# Patient Record
Sex: Female | Born: 1970
Health system: Southern US, Community
[De-identification: ages and names within clinical notes are randomized; demographics above are authoritative.]

## PROBLEM LIST (undated history)

## (undated) DIAGNOSIS — T7840XA Allergy, unspecified, initial encounter: Secondary | ICD-10-CM

## (undated) DIAGNOSIS — F988 Other specified behavioral and emotional disorders with onset usually occurring in childhood and adolescence: Secondary | ICD-10-CM

## (undated) DIAGNOSIS — M2550 Pain in unspecified joint: Secondary | ICD-10-CM

## (undated) DIAGNOSIS — J45909 Unspecified asthma, uncomplicated: Secondary | ICD-10-CM

## (undated) DIAGNOSIS — E669 Obesity, unspecified: Secondary | ICD-10-CM

## (undated) DIAGNOSIS — J302 Other seasonal allergic rhinitis: Secondary | ICD-10-CM

## (undated) DIAGNOSIS — I252 Old myocardial infarction: Secondary | ICD-10-CM

## (undated) DIAGNOSIS — M549 Dorsalgia, unspecified: Secondary | ICD-10-CM

## (undated) HISTORY — PX: WISDOM TOOTH EXTRACTION: SHX21

## (undated) HISTORY — DX: Other seasonal allergic rhinitis: J30.2

## (undated) HISTORY — DX: Unspecified asthma, uncomplicated: J45.909

## (undated) HISTORY — DX: Dorsalgia, unspecified: M54.9

## (undated) HISTORY — DX: Old myocardial infarction: I25.2

## (undated) HISTORY — DX: Pain in unspecified joint: M25.50

## (undated) HISTORY — DX: Allergy, unspecified, initial encounter: T78.40XA

## (undated) HISTORY — DX: Other specified behavioral and emotional disorders with onset usually occurring in childhood and adolescence: F98.8

## (undated) HISTORY — PX: TONSILLECTOMY: SUR1361

## (undated) HISTORY — DX: Obesity, unspecified: E66.9

---

## 2008-01-18 ENCOUNTER — Other Ambulatory Visit: Admission: RE | Admit: 2008-01-18 | Discharge: 2008-01-18 | Payer: Self-pay | Admitting: Obstetrics and Gynecology

## 2010-12-17 DIAGNOSIS — J358 Other chronic diseases of tonsils and adenoids: Secondary | ICD-10-CM | POA: Insufficient documentation

## 2010-12-17 DIAGNOSIS — N943 Premenstrual tension syndrome: Secondary | ICD-10-CM | POA: Insufficient documentation

## 2010-12-17 DIAGNOSIS — J309 Allergic rhinitis, unspecified: Secondary | ICD-10-CM | POA: Insufficient documentation

## 2013-08-23 ENCOUNTER — Encounter: Payer: Self-pay | Admitting: Obstetrics & Gynecology

## 2013-08-23 ENCOUNTER — Ambulatory Visit (INDEPENDENT_AMBULATORY_CARE_PROVIDER_SITE_OTHER): Payer: Federal, State, Local not specified - PPO | Admitting: Obstetrics & Gynecology

## 2013-08-23 VITALS — BP 127/70 | HR 80 | Resp 16 | Ht 67.0 in | Wt 219.0 lb

## 2013-08-23 DIAGNOSIS — Z01419 Encounter for gynecological examination (general) (routine) without abnormal findings: Secondary | ICD-10-CM

## 2013-08-23 DIAGNOSIS — Z1151 Encounter for screening for human papillomavirus (HPV): Secondary | ICD-10-CM

## 2013-08-23 DIAGNOSIS — Z124 Encounter for screening for malignant neoplasm of cervix: Secondary | ICD-10-CM

## 2013-08-23 DIAGNOSIS — Z0000 Encounter for general adult medical examination without abnormal findings: Secondary | ICD-10-CM

## 2013-08-23 NOTE — Progress Notes (Signed)
Subjective:    Rachel [name] is a 43 y.o. female who presents for an annual exam. The patient has no complaints today. The patient is sexually active. GYN screening history: last pap: was normal. The patient wears seatbelts: yes. The patient participates in regular exercise: no. Has the patient ever been transfused or tattooed?: no. The patient reports that there is not domestic violence in her life.   Menstrual History: OB History   Grav Para Term Preterm Abortions TAB SAB Ect Mult Living   2 2 2       2       Menarche age: [age]  Patient's last menstrual period was 08/02/2013.    The following portions of the patient's history were reviewed and updated as appropriate: allergies, current medications, past family history, past medical history, past social history, past surgical history and problem list.  Review of Systems A comprehensive review of systems was negative. From [place] originally. She is a Teacher, English as a foreign languagepanish interpretor, free lance. Married for 21 years.  Kids are 7818 daughter and 43 yo son. Periods are monthly for 3 days. Her husband had a vasectomy. No mammogram ever.   Objective:    BP 127/70  Pulse 80  Resp 16  Ht 5\' 7"  (1.702 m)  Wt 219 lb (99.338 kg)  BMI 34.29 kg/m2  LMP 08/02/2013  General Appearance:    Alert, cooperative, no distress, appears stated age  Head:    Normocephalic, without obvious abnormality, atraumatic  Eyes:    PERRL, conjunctiva/corneas clear, EOM's intact, fundi    benign, both eyes  Ears:    Normal TM's and external ear canals, both ears  Nose:   Nares normal, septum midline, mucosa normal, no drainage    or sinus tenderness  Throat:   Lips, mucosa, and tongue normal; teeth and gums normal  Neck:   Supple, symmetrical, trachea midline, no adenopathy;    thyroid:  no enlargement/tenderness/nodules; no carotid   bruit or JVD  Back:     Symmetric, no curvature, ROM normal, no CVA tenderness  Lungs:     Clear to auscultation bilaterally, respirations  unlabored  Chest Wall:    No tenderness or deformity   Heart:    Regular rate and rhythm, S1 and S2 normal, no murmur, rub   or gallop  Breast Exam:    No tenderness, masses, or nipple abnormality  Abdomen:     Soft, non-tender, bowel sounds active all four quadrants,    no masses, no organomegaly  Genitalia:    Normal female without lesion, discharge or tenderness     Extremities:   Extremities normal, atraumatic, no cyanosis or edema  Pulses:   2+ and symmetric all extremities  Skin:   Skin color, texture, turgor normal, no rashes or lesions  Lymph nodes:   Cervical, supraclavicular, and axillary nodes normal  Neurologic:   CNII-XII intact, normal strength, sensation and reflexes    throughout  .    Assessment:    Healthy female exam.    Plan:     Breast self exam technique reviewed and patient encouraged to perform self-exam monthly. Mammogram. Thin prep Pap smear.  with cotesting

## 2013-08-30 ENCOUNTER — Ambulatory Visit (INDEPENDENT_AMBULATORY_CARE_PROVIDER_SITE_OTHER): Payer: Federal, State, Local not specified - PPO

## 2013-08-30 DIAGNOSIS — Z1231 Encounter for screening mammogram for malignant neoplasm of breast: Secondary | ICD-10-CM

## 2013-08-30 DIAGNOSIS — Z0000 Encounter for general adult medical examination without abnormal findings: Secondary | ICD-10-CM

## 2014-02-11 ENCOUNTER — Encounter: Payer: Self-pay | Admitting: Obstetrics & Gynecology

## 2014-04-08 ENCOUNTER — Emergency Department
Admission: EM | Admit: 2014-04-08 | Discharge: 2014-04-08 | Disposition: A | Discharge status: Home / Self Care | Payer: Federal, State, Local not specified - PPO | Source: Home / Self Care

## 2014-04-08 ENCOUNTER — Encounter: Payer: Self-pay | Admitting: *Deleted

## 2014-04-08 DIAGNOSIS — J069 Acute upper respiratory infection, unspecified: Secondary | ICD-10-CM

## 2014-04-08 DIAGNOSIS — J028 Acute pharyngitis due to other specified organisms: Secondary | ICD-10-CM

## 2014-04-08 LAB — POCT INFLUENZA A/B
INFLUENZA A, POC: NEGATIVE
INFLUENZA B, POC: NEGATIVE

## 2014-04-08 LAB — POCT RAPID STREP A (OFFICE): RAPID STREP A SCREEN: NEGATIVE

## 2014-04-08 MED ORDER — DOXYCYCLINE HYCLATE 100 MG PO CAPS: 100.0000 mg | ORAL_CAPSULE | Freq: Two times a day (BID) | ORAL | Status: AC

## 2014-04-08 NOTE — ED Notes (Signed)
Pt c/o yellow nasal congestion and dry cough x 1 wk. Denies fever.

## 2014-04-08 NOTE — ED Provider Notes (Signed)
CSN: [id number]     Arrival date & time 04/08/14  1044 History   None    Chief Complaint  Patient presents with  . Nasal Congestion    HPI  URI Symptoms Onset: 1 week  Description: sore throat, nasal congestion, body aches  Modifying factors:  Multiple sick contacts with similar sxs, no flu shot this year   Symptoms Nasal discharge: yes Fever: no Sore throat: yes Cough: no Wheezing: no Ear pain: no GI symptoms: no Sick contacts: yes  Red Flags  Stiff neck: no Dyspnea: no Rash: no Swallowing difficulty: no  Sinusitis Risk Factors Headache/face pain: no Double sickening: no tooth pain: no  Allergy Risk Factors Sneezing: no Itchy scratchy throat: no Seasonal symptoms: yes  Flu Risk Factors Headache: no muscle aches: mild  severe fatigue: mild    Past Medical History  Diagnosis Date  . Allergy   . Obesity    Past Surgical History  Procedure Laterality Date  . Wisdom tooth extraction    . Tonsillectomy     Family History  Problem Relation Age of Onset  . Heart attack Maternal Grandfather   . Heart attack Maternal Uncle   . Diabetes Maternal Aunt    History  Substance Use Topics  . Smoking status: Former Games developermoker  . Smokeless tobacco: Never Used  . Alcohol Use: No   OB History    Gravida Para Term Preterm AB TAB SAB Ectopic Multiple Living   2 2 2       2      Review of Systems  All other systems reviewed and are negative.   Allergies  Review of patient's allergies indicates no known allergies.  Home Medications   Prior to Admission medications   Medication Sig Start Date End Date Taking? Authorizing Provider  ciclesonide (OMNARIS) 50 MCG/ACT nasal spray Place 2 sprays into both nostrils daily.    Historical Provider, MD  fexofenadine (ALLEGRA) 180 MG tablet Take 180 mg by mouth daily.    Historical Provider, MD   BP 117/79 mmHg  Pulse 83  Temp(Src) 98 F (36.7 C) (Oral)  Resp 18  Ht 5\' 7"  (1.702 m)  Wt 221 lb (100.245 kg)  BMI  34.61 kg/m2  SpO2 99%  LMP 03/10/2014 Physical Exam  Constitutional: She appears well-developed and well-nourished.  HENT:  Head: Normocephalic and atraumatic.  Right Ear: External ear normal.  Left Ear: External ear normal.  +nasal erythema, rhinorrhea bilaterally, + post oropharyngeal erythema    Eyes: Conjunctivae are normal. Pupils are equal, round, and reactive to light.  Neck: Normal range of motion. Neck supple.  Cardiovascular: Normal rate and regular rhythm.   Pulmonary/Chest: Effort normal and breath sounds normal.  Neurological: She is alert.  Skin: Skin is warm.    ED Course  Procedures (including critical care time) Labs Review Labs Reviewed - No data to display  Imaging Review No results found.   MDM   1. Acute pharyngitis due to other specified organisms   2. URI (upper respiratory infection)    Likely viral process Rapid strep and flu negative Discussed supportive care and infectious/resp red flags ppx rx for doxy if sxs not improved/worsen Follow up as needed.     The patient and/or caregiver has been counseled thoroughly with regard to treatment plan and/or medications prescribed including dosage, schedule, interactions, rationale for use, and possible side effects and they verbalize understanding. Diagnoses and expected course of recovery discussed and will return if not improved as expected or  if the condition worsens. Patient and/or caregiver verbalized understanding.         Doree AlbeeSteven Noris Kulinski, MD 04/08/14 [phone]

## 2014-12-02 ENCOUNTER — Encounter: Payer: Self-pay | Admitting: Family Medicine

## 2014-12-02 ENCOUNTER — Ambulatory Visit (INDEPENDENT_AMBULATORY_CARE_PROVIDER_SITE_OTHER): Payer: Federal, State, Local not specified - PPO | Admitting: Family Medicine

## 2014-12-02 VITALS — BP 131/75 | HR 80 | Ht 67.0 in | Wt 228.0 lb

## 2014-12-02 DIAGNOSIS — Z283 Underimmunization status: Secondary | ICD-10-CM

## 2014-12-02 DIAGNOSIS — R635 Abnormal weight gain: Secondary | ICD-10-CM

## 2014-12-02 DIAGNOSIS — Z23 Encounter for immunization: Secondary | ICD-10-CM | POA: Diagnosis not present

## 2014-12-02 DIAGNOSIS — Z2839 Other underimmunization status: Secondary | ICD-10-CM

## 2014-12-02 DIAGNOSIS — M79644 Pain in right finger(s): Secondary | ICD-10-CM | POA: Diagnosis not present

## 2014-12-02 NOTE — Progress Notes (Signed)
CC: Rachel [name] is a 44 y.o. female is here for Establish Care   Subjective: HPI:  Very pleasant 44 year old here to establish care  Patient reports difficulty with losing weight. She admits there is a little bit that she can do with her diet to improve the type of calories she is consuming but she is already cut out all fried foods. She exercises most days of week but does not seem to get results. This is been going on for matter of months maybe even a year. Symptoms are moderate in severity on a weekly basis. She's never had her thyroid checked.  Complains of right index finger pain localized at the base of the finger and in some of the knuckles. It's worse when going from hot to cold or cold hot environments. It's worse with repetitive movements such as pulling weeds or gardening. Occasionally will swell. She denies any warmth or redness at the site of discomfort. No interventions as of yet but this has been going on for the majority of the summer. Denies any fevers, chills, muscle atrophy or weakness. Denies any recent or remote trauma  She would like me to help her with immunization requirements at [facility] she was born in [place] and had the majority of her pediatric care in [place]. She does not know the name of the clinic that she used to get her shots from. She believes she is up-to-date on all of her immunizations other than needing tetanus booster. She needs to have evidence of MMR, polio,and hepatitis B vaccinations.  Review of Systems - General ROS: negative for - chills, fever, night sweats, weight loss Ophthalmic ROS: negative for - decreased vision Psychological ROS: negative for - anxiety or depression ENT ROS: negative for - hearing change, nasal congestion, tinnitus or allergies Hematological and Lymphatic ROS: negative for - bleeding problems, bruising or swollen lymph nodes Breast ROS: negative Respiratory ROS: no cough, shortness of breath, or  wheezing Cardiovascular ROS: no chest pain or dyspnea on exertion Gastrointestinal ROS: no abdominal pain, change in bowel habits, or black or bloody stools Genito-Urinary ROS: negative for - genital discharge, genital ulcers, incontinence or abnormal bleeding from genitals Musculoskeletal ROS: negative for - joint pain or muscle painother than that described above Neurological ROS: negative for - headaches or memory loss Dermatological ROS: negative for lumps, mole changes, rash and skin lesion changes  Past Medical History  Diagnosis Date  . Allergy   . Obesity     Past Surgical History  Procedure Laterality Date  . Wisdom tooth extraction    . Tonsillectomy     Family History  Problem Relation Age of Onset  . Heart attack Maternal Grandfather   . Heart attack Maternal Uncle   . Diabetes Maternal Aunt     Social History   Social History  . Marital Status: Married    Spouse Name: N/A  . Number of Children: N/A  . Years of Education: N/A   Occupational History  . spanish interpreter    Social History Main Topics  . Smoking status: Former Research scientist (life sciences)  . Smokeless tobacco: Never Used  . Alcohol Use: No  . Drug Use: No  . Sexual Activity:    Partners: Male   Other Topics Concern  . Not on file   Social History Narrative     Objective: BP 131/75 mmHg  Pulse 80  Ht $R'5\' 7"'Kp$  (1.702 m)  Wt 228 lb (103.42 kg)  BMI 35.70 kg/m2  General: Alert and  Oriented, No Acute Distress HEENT: Pupils equal, round, reactive to light. Conjunctivae clear.  External ears unremarkable, canals clear with intact TMs with appropriate landmarks.  Middle ear appears open without effusion. Pink inferior turbinates.  Moist mucous membranes, pharynx without inflammation nor lesions.  Neck supple without palpable lymphadenopathy nor abnormal masses. Lungs: Clear to auscultation bilaterally, no wheezing/ronchi/rales.  Comfortable work of breathing. Good air movement. Cardiac: Regular rate and rhythm.  Normal S1/S2.  No murmurs, rubs, nor gallops.   Extremities: No peripheral edema.  Strong peripheral pulses.no swelling redness or warmth  At any of the joints in the right index finger. Full range of motion and strength. Pain is reproduced with passive extension of the finger.   Mental Status: No depression, anxiety, nor agitation. Skin: Warm and dry.  Assessment & Plan: Rachel [name] was seen today for establish care.  Diagnoses and all orders for this visit:  Abnormal weight gain -     TSH  Finger pain, right  Immunization deficiency -     Tdap vaccine greater than or equal to 7yo IM   Abnormal weight gain: Rule out hypothyroidism Finger pain: Suspect this is due to osteoarthritis, I showed her how to buddy tape her finger to the middle finger and encourage her to do this as many hours of the day as possible. Immunization deficiency: Giving Tdap now, it sounds like it's going to be nearly impossible to track down her old records outside this country. I've asked her to  Inquire with her admissions counselor on whether or not we can do labwork to prove her immunity.  Return in about 3 months (around 03/04/2015) for physical.

## 2014-12-03 LAB — TSH: TSH: 1.003 u[IU]/mL (ref 0.350–4.500)

## 2014-12-09 ENCOUNTER — Encounter: Payer: Self-pay | Admitting: *Deleted

## 2015-03-03 ENCOUNTER — Ambulatory Visit: Payer: Self-pay | Admitting: Podiatry

## 2015-03-10 ENCOUNTER — Encounter: Payer: Self-pay | Admitting: Podiatry

## 2015-03-10 ENCOUNTER — Ambulatory Visit (INDEPENDENT_AMBULATORY_CARE_PROVIDER_SITE_OTHER): Payer: Federal, State, Local not specified - PPO | Admitting: Podiatry

## 2015-03-10 VITALS — BP 137/75 | HR 82 | Ht 67.0 in | Wt 218.0 lb

## 2015-03-10 DIAGNOSIS — G5762 Lesion of plantar nerve, left lower limb: Secondary | ICD-10-CM | POA: Diagnosis not present

## 2015-03-10 DIAGNOSIS — M722 Plantar fascial fibromatosis: Secondary | ICD-10-CM | POA: Diagnosis not present

## 2015-03-10 DIAGNOSIS — M21969 Unspecified acquired deformity of unspecified lower leg: Secondary | ICD-10-CM | POA: Diagnosis not present

## 2015-03-10 NOTE — Progress Notes (Signed)
SUBJECTIVE: 44 y.o. year old female presents complaining of left foot pain for about a year.  2nd and 3rd toe are separating with pain, tingling, numbness with ambulation. Too painful to walk.  When wakes up or getting out of chair gets pain on left heel that needs be stretched before taking steps.  Wearing flat shoes or high heels, or sandals. On feet at work about half and half.  Does ride bike. If use treadmill, too painful to last long enough.   REVIEW OF SYSTEMS: Constitutional: negative for chills, fatigue, fevers, night sweats and weight loss Eyes: negative Ears, nose, mouth, throat, and face: negative Respiratory: negative Cardiovascular: negative Gastrointestinal: negative Genitourinary:negative Integument/breast: negative Hematologic/lymphatic: negative Musculoskeletal:negative Neurological: negative Endocrine: negative Allergic/Immunologic: negative  OBJECTIVE: DERMATOLOGIC EXAMINATION: Nails: Mild whitish discoloration at distal 1/4 of both great toe nails. No abnormal skin lesions.  VASCULAR EXAMINATION OF LOWER LIMBS: Pedal pulses: All pedal pulses are palpable with normal pulsation.  Temperature gradient from tibial crest to dorsum of foot is within normal bilateral. NEUROLOGIC EXAMINATION OF THE LOWER LIMBS: All epicritic and tactile sensations grossly intact.  MUSCULOSKELETAL EXAMINATION: Positive of hypermobile first ray in sagittal plane bilateral. Positive of pain at the 2nd intermetatarsal space with clicking of soft tissue mass with lateral pressure. Pain in left heel when getting out of bed or chair x 6 months.   ASSESSMENT: Hypermobile first ray bilateral. STJ hyperpronation bilateral. Motons's neuroma left.  Ligamentous laxity bilateral.  PLAN: Reviewed clinical findings and available treatment options, injection, change in shoe gear and activities, custom orthotics. Patient refused to take injection. Patient will return for custom  orthotics. Metatarsal binder dispensed to add stability of first ray bilateral.

## 2015-03-10 NOTE — Patient Instructions (Signed)
Seen for pain in left foot.  Possible Diagnosis: Plantar fasciitis left. Morton's neuroma at 2nd intermetatarsal space. Reviewed available treatment options. Metatarsal binder dispensed x 1 pr.  Will prepare for custom orthotics next week.

## 2015-03-20 ENCOUNTER — Ambulatory Visit: Payer: Federal, State, Local not specified - PPO | Admitting: Podiatry

## 2015-03-24 ENCOUNTER — Ambulatory Visit: Payer: Federal, State, Local not specified - PPO | Admitting: Podiatry

## 2015-04-10 ENCOUNTER — Encounter: Payer: Self-pay | Admitting: Podiatry

## 2015-04-10 ENCOUNTER — Other Ambulatory Visit: Payer: Self-pay

## 2015-04-10 ENCOUNTER — Ambulatory Visit (INDEPENDENT_AMBULATORY_CARE_PROVIDER_SITE_OTHER): Payer: Federal, State, Local not specified - PPO | Admitting: Podiatry

## 2015-04-10 VITALS — BP 132/69 | HR 80

## 2015-04-10 DIAGNOSIS — M216X9 Other acquired deformities of unspecified foot: Secondary | ICD-10-CM

## 2015-04-10 DIAGNOSIS — M216X1 Other acquired deformities of right foot: Secondary | ICD-10-CM | POA: Insufficient documentation

## 2015-04-10 DIAGNOSIS — M722 Plantar fascial fibromatosis: Secondary | ICD-10-CM

## 2015-04-10 DIAGNOSIS — G5762 Lesion of plantar nerve, left lower limb: Secondary | ICD-10-CM | POA: Diagnosis not present

## 2015-04-10 DIAGNOSIS — M21969 Unspecified acquired deformity of unspecified lower leg: Secondary | ICD-10-CM

## 2015-04-10 DIAGNOSIS — M216X2 Other acquired deformities of left foot: Principal | ICD-10-CM

## 2015-04-10 NOTE — Progress Notes (Signed)
Subjective: 44 year old female presents requesting custom orthotics made. She was seen last months and was treated with Metatarsal binder for weakened first ray, heel pain, and neuroma at 2nd interspace left foot. Stated that she has made some improvement in her feet since the last visit. She wants orthotics to be made to fit to dress shoes  OBJECTIVE: DERMATOLOGIC EXAMINATION: Nails: Mild whitish discoloration at distal 1/4 of both great toe nails. No abnormal skin lesions.  VASCULAR EXAMINATION OF LOWER LIMBS: Pedal pulses: All pedal pulses are palpable with normal pulsation.  Temperature gradient from tibial crest to dorsum of foot is within normal bilateral. NEUROLOGIC EXAMINATION OF THE LOWER LIMBS: All epicritic and tactile sensations grossly intact.  MUSCULOSKELETAL EXAMINATION: Positive of hypermobile first ray in sagittal plane bilateral. Positive of pain at the 2nd intermetatarsal space with clicking of soft tissue mass with lateral pressure. Pain in left heel when getting out of bed or chair x 6 months.   Radiographic examination reveal short first metatarsal bone bilateral, minimum change in lateral deviation angle of CCJ bilateral. Lateral view show normal CYMA line, elevated first ray right. Lateral left foot has anterior advancement of CYMA line with elevated first.  ASSESSMENT: Hypermobile first ray bilateral. STJ hyperpronation bilateral. Motons's neuroma left.  Ligamentous laxity bilateral.  PLAN: Reviewed clinical findings and available treatment options, injection, change in shoe gear and activities, custom orthotics. Patient refused to take injection. Patient will return for custom orthotics. Metatarsal binder dispensed to add stability of first ray bilateral.

## 2015-06-18 ENCOUNTER — Ambulatory Visit: Payer: Federal, State, Local not specified - PPO | Admitting: Podiatry

## 2015-08-19 DIAGNOSIS — L237 Allergic contact dermatitis due to plants, except food: Secondary | ICD-10-CM | POA: Diagnosis not present

## 2015-08-23 ENCOUNTER — Emergency Department
Admission: EM | Admit: 2015-08-23 | Discharge: 2015-08-23 | Disposition: A | Discharge status: Home / Self Care | Payer: Federal, State, Local not specified - PPO | Source: Home / Self Care | Attending: Family Medicine | Admitting: Family Medicine

## 2015-08-23 ENCOUNTER — Encounter: Payer: Self-pay | Admitting: Emergency Medicine

## 2015-08-23 DIAGNOSIS — L03113 Cellulitis of right upper limb: Secondary | ICD-10-CM | POA: Diagnosis not present

## 2015-08-23 DIAGNOSIS — L03115 Cellulitis of right lower limb: Secondary | ICD-10-CM

## 2015-08-23 DIAGNOSIS — L259 Unspecified contact dermatitis, unspecified cause: Secondary | ICD-10-CM | POA: Diagnosis not present

## 2015-08-23 MED ORDER — HYDROXYZINE HCL 25 MG PO TABS: 25.0000 mg | ORAL_TABLET | Freq: Four times a day (QID) | ORAL | Status: DC

## 2015-08-23 MED ORDER — PREDNISONE 20 MG PO TABS: ORAL_TABLET | ORAL | Status: DC

## 2015-08-23 MED ORDER — CEPHALEXIN 500 MG PO CAPS: 500.0000 mg | ORAL_CAPSULE | Freq: Two times a day (BID) | ORAL | Status: DC

## 2015-08-23 MED ORDER — METHYLPREDNISOLONE SODIUM SUCC 40 MG IJ SOLR
80.0000 mg | Freq: Once | INTRAMUSCULAR | Status: AC
  Administered 2015-08-23: 80 mg via INTRAMUSCULAR

## 2015-08-23 NOTE — Discharge Instructions (Signed)
You were given a shot of Solumedrol (a steroid) today to help with itching and swelling from a likely allergic reaction.  You have been prescribed 5 days of prednisone, an oral steroid.  You may start this medication tomorrow with breakfast.    Please take antibiotics (cephalexin- keflex) as prescribed and be sure to complete entire course even if you start to feel better to ensure infection does not come back.  Atarax (hydroxyzine) is an antihistamine you may take to help with itching.  This medication may cause drowsiness, do not drive or drink alcohol while taking.    You may continue to use the cream that was prescribed by your dermatologist.  If not improving in 1 week, please follow up with your primary care provider or dermatologist.

## 2015-08-23 NOTE — ED Provider Notes (Signed)
CSN: [id number]     Arrival date & time 08/23/15  1056 History   First MD Initiated Contact with Patient 08/23/15 1109     Chief Complaint  Patient presents with  . Rash   (Consider location/radiation/quality/duration/timing/severity/associated sxs/prior Treatment) HPI The pt is a 45yo [facility] c/o moderately to severely pruritic erythematous rash with some areas draining clear-yellow fluid.  Pt was dx with allergic reaction to poison ivy after working in the yard 3 weeks ago.  She was seen by her dermatologist on 08/19/15, given an "injection" and a cream but rash seems to keep spreading.  Rash on face cleared but one area on her leg is concerning because it is warm to the touch and sore.  Denies fever, n/v/d. Denies oral swelling. No new soaps, lotions or medications. No known allergies.  Past Medical History  Diagnosis Date  . Allergy   . Obesity    Past Surgical History  Procedure Laterality Date  . Wisdom tooth extraction    . Tonsillectomy     Family History  Problem Relation Age of Onset  . Heart attack Maternal Grandfather   . Heart attack Maternal Uncle   . Diabetes Maternal Aunt    Social History  Substance Use Topics  . Smoking status: Former Smoker    Quit date: 09/01/1995  . Smokeless tobacco: Never Used  . Alcohol Use: No   OB History    Gravida Para Term Preterm AB TAB SAB Ectopic Multiple Living   2 2 2       2      Review of Systems  Constitutional: Negative for fever and chills.  Gastrointestinal: Negative for nausea, vomiting and diarrhea.  Musculoskeletal: Negative for myalgias and arthralgias.  Skin: Positive for color change, rash and wound. Negative for pallor.    Allergies  Review of patient's allergies indicates no known allergies.  Home Medications   Prior to Admission medications   Medication Sig Start Date End Date Taking? Authorizing Provider  cephALEXin (KEFLEX) 500 MG capsule Take 1 capsule (500 mg total) by mouth 2  (two) times daily. 08/23/15   Junius FinnerErin O'Malley, PA-C  fexofenadine (ALLEGRA) 180 MG tablet Take 180 mg by mouth daily.    Historical Provider, MD  hydrOXYzine (ATARAX/VISTARIL) 25 MG tablet Take 1 tablet (25 mg total) by mouth every 6 (six) hours. 08/23/15   Junius FinnerErin O'Malley, PA-C  predniSONE (DELTASONE) 20 MG tablet 3 tabs po day one, then 2 po daily x 4 days 08/23/15   Junius FinnerErin O'Malley, PA-C   Meds Ordered and Administered this Visit   Medications  methylPREDNISolone sodium succinate (SOLU-MEDROL) 40 mg/mL injection 80 mg (80 mg Intramuscular Given 08/23/15 1144)    BP 116/74 mmHg  Pulse 60  Temp(Src) 98.5 F (36.9 C) (Oral)  Resp 16  Ht 5\' 7"  (1.702 m)  Wt 219 lb (99.338 kg)  BMI 34.29 kg/m2  SpO2 97% No data found.   Physical Exam  Constitutional: She is oriented to person, place, and time. She appears well-developed and well-nourished.  HENT:  Head: Normocephalic and atraumatic.  Mouth/Throat: Oropharynx is clear and moist.  No oral swelling  Eyes: EOM are normal.  Neck: Normal range of motion.  Cardiovascular: Normal rate.   Pulmonary/Chest: Effort normal.  Musculoskeletal: Normal range of motion. She exhibits tenderness (Right medical thigh (see skin exam)). She exhibits no edema.  Neurological: She is alert and oriented to person, place, and time.  Skin: Skin is warm and dry. Rash noted.  There is erythema.  Diffuse erythematous papular rash with small bullae and vesicles on bilateral arms, abdomen and drink.  Some vesicles have dried and crusted over.   Right forearm: 2cm area of erythema and warmth with overlying lesions. Right medial thigh: erythematous papule surrounded by 3cm erythema and warm. Tender to touch. No induration or fluctuance  Psychiatric: She has a normal mood and affect. Her behavior is normal.  Nursing note and vitals reviewed.   ED Course  Procedures (including critical care time)  Labs Review Labs Reviewed - No data to display  Imaging Review No  results found.    MDM   1. Contact dermatitis   2. Cellulitis of right thigh   3. Cellulitis of right forearm    Pt c/o worsening pruritic rash c/w contact dermatitis with evidence of underlying infection.  Tx: Solumedrol  IM Rx: keflex, atarax, and prednisone   F/u with PCP or dermatologist in 1 week if not improving, sooner if worsening. Patient verbalized understanding and agreement with treatment plan.      Junius Finner, PA-C 08/23/15 1153

## 2015-08-23 NOTE — ED Notes (Signed)
Patient was in contact with poison ivy 3 weeks ago; face lesions cleared; was given "injection" by dermatologist 08/19/2015; continues to have spreading rash now on both arms, legs trunk, and back.

## 2015-09-24 DIAGNOSIS — K080 Exfoliation of teeth due to systemic causes: Secondary | ICD-10-CM | POA: Diagnosis not present

## 2016-04-26 ENCOUNTER — Encounter: Payer: Self-pay | Admitting: *Deleted

## 2016-04-26 ENCOUNTER — Emergency Department (INDEPENDENT_AMBULATORY_CARE_PROVIDER_SITE_OTHER)
Admission: EM | Admit: 2016-04-26 | Discharge: 2016-04-26 | Disposition: A | Discharge status: Home / Self Care | Payer: Federal, State, Local not specified - PPO | Source: Home / Self Care | Attending: Family Medicine | Admitting: Family Medicine

## 2016-04-26 DIAGNOSIS — H66003 Acute suppurative otitis media without spontaneous rupture of ear drum, bilateral: Secondary | ICD-10-CM

## 2016-04-26 DIAGNOSIS — B9789 Other viral agents as the cause of diseases classified elsewhere: Secondary | ICD-10-CM | POA: Diagnosis not present

## 2016-04-26 DIAGNOSIS — J069 Acute upper respiratory infection, unspecified: Secondary | ICD-10-CM

## 2016-04-26 MED ORDER — AMOXICILLIN-POT CLAVULANATE 875-125 MG PO TABS: 1.0000 | ORAL_TABLET | Freq: Two times a day (BID) | ORAL | 0 refills | Status: DC

## 2016-04-26 MED ORDER — PREDNISONE 20 MG PO TABS: ORAL_TABLET | ORAL | 0 refills | Status: DC

## 2016-04-26 MED ORDER — GUAIFENESIN-CODEINE 100-10 MG/5ML PO SOLN: ORAL | 0 refills | Status: DC

## 2016-04-26 NOTE — ED Triage Notes (Signed)
Patient c/o 5 days of non-productive cough, nasal congestion. Taken Theraflu and advil cold/sinus otc. Afebrile.

## 2016-04-26 NOTE — ED Provider Notes (Signed)
[name]    CSN: [id number] Arrival date & time: 04/26/16  0935     History   Chief Complaint Chief Complaint  Patient presents with  . Cough  . Nasal Congestion    HPI Rachel [name] is a 46 y.o. female.   Patient complains of five day history of typical cold-like symptoms developing over several days, including mild sore throat, sinus congestion, headache, fatigue, and cough.  Her cough is non-productive and worse at night.  No fever but she has had chills.  She has developed bilateral earache during the past two days. She has a history of seasonal rhinitis, and multiple episodes of sinusitis in the past.  Her son has had mild reactive airways symptoms in the past.  She notes that she always has prolonged viral URI's.   The history is provided by the patient.    Past Medical History:  Diagnosis Date  . Allergy   . Obesity     Patient Active Problem List   Diagnosis Date Noted  . Pronation deformity of both feet 04/10/2015  . Morton's neuroma of left foot 03/10/2015  . Metatarsal deformity 03/10/2015  . Plantar fasciitis of left foot 03/10/2015    Past Surgical History:  Procedure Laterality Date  . TONSILLECTOMY    . WISDOM TOOTH EXTRACTION      OB History    Gravida Para Term Preterm AB Living   2 2 2     2    SAB TAB Ectopic Multiple Live Births                   Home Medications    Prior to Admission medications   Medication Sig Start Date End Date Taking? Authorizing Provider  amoxicillin-clavulanate (AUGMENTIN) 875-125 MG tablet Take 1 tablet by mouth 2 (two) times daily. Take with food 04/26/16   Lattie Haw, MD  fexofenadine (ALLEGRA) 180 MG tablet Take 180 mg by mouth daily.    Historical Provider, MD  guaiFENesin-codeine 100-10 MG/5ML syrup Take 10mL by mouth at bedtime as needed for cough 04/26/16   Lattie Haw, MD  predniSONE (DELTASONE) 20 MG tablet Take one tab by mouth twice daily for 5 days, then one daily for 3 days.  Take with food. 04/26/16   Lattie Haw, MD    Family History Family History  Problem Relation Age of Onset  . Hypertension Father   . Heart attack Maternal Grandfather   . Heart attack Maternal Uncle   . Diabetes Maternal Aunt     Social History Social History  Substance Use Topics  . Smoking status: Former Smoker    Quit date: 09/01/1995  . Smokeless tobacco: Never Used  . Alcohol use No     Allergies   Patient has no known allergies.   Review of Systems Review of Systems + sore throat + cough No pleuritic pain No wheezing + nasal congestion + post-nasal drainage  sinus pain/pressure No itchy/red eyes + earache No hemoptysis No SOB No fever, + chills No nausea No vomiting No abdominal pain No diarrhea No urinary symptoms No skin rash + fatigue No myalgias + headache Used OTC meds without relief   Physical Exam Triage Vital Signs ED Triage Vitals  Enc Vitals Group     BP 04/26/16 1103 128/80     Pulse Rate 04/26/16 1103 70     Resp 04/26/16 1103 14     Temp 04/26/16 1103 97.7 F (36.5 C)  Temp Source 04/26/16 1103 Oral     SpO2 04/26/16 1103 97 %     Weight 04/26/16 1104 226 lb (102.5 kg)     Height --      Head Circumference --      Peak Flow --      Pain Score 04/26/16 1105 3     Pain Loc --      Pain Edu? --      Excl. in GC? --    No data found.   Updated Vital Signs BP 128/80 (BP Location: Left Arm)   Pulse 70   Temp 97.7 F (36.5 C) (Oral)   Resp 14   Wt 226 lb (102.5 kg)   LMP 04/08/2016   SpO2 97%   BMI 35.40 kg/m   Visual Acuity Right Eye Distance:   Left Eye Distance:   Bilateral Distance:    Right Eye Near:   Left Eye Near:    Bilateral Near:     Physical Exam Nursing notes and Vital Signs reviewed. Appearance:  Patient appears stated age, and in no acute distress Eyes:  Pupils are equal, round, and reactive to light and accomodation.  Extraocular movement is intact.  Conjunctivae are not inflamed    Ears:  Canals normal.  Tympanic membranes are erythematous with effusions present and decreased landmarks. Nose:  Mildly congested turbinates.  Maxillary sinus tenderness is present.  Pharynx:  Normal Neck:  Supple.  Tender enlarged posterior/lateral nodes are palpated bilaterally  Lungs:  Clear to auscultation.  Breath sounds are equal.  Moving air well. Heart:  Regular rate and rhythm without murmurs, rubs, or gallops.  Abdomen:  Nontender without masses or hepatosplenomegaly.  Bowel sounds are present.  No CVA or flank tenderness.  Extremities:  No edema.  Skin:  No rash present.    UC Treatments / Results  Labs (all labs ordered are listed, but only abnormal results are displayed) Labs Reviewed - No data to display  EKG  EKG Interpretation None       Radiology No results found.  Procedures Procedures (including critical care time)  Medications Ordered in UC Medications - No data to display   Initial Impression / Assessment and Plan / UC Course  I have reviewed the triage vital signs and the nursing notes.  Pertinent labs & imaging results that were available during my care of the patient were reviewed by me and considered in my medical decision making (see chart for details).  Clinical Course   With patient's history of seasonal rhinitis, family history of asthma, and past history of viral URI's that linger, she probably has a predisposition to mild reactive airways disease.  Begin Augmentin, and prednisone burst/taper. Rx for Robitussin AC for night time cough.  Take plain guaifenesin (1200mg  extended release tabs such as Mucinex) twice daily, with plenty of water, for cough and congestion.  May add Pseudoephedrine (30mg , one or two every 4 to 6 hours) for sinus congestion.  Get adequate rest.   May use Afrin nasal spray (or generic oxymetazoline) twice daily for about 5 days and then discontinue.  Also recommend using saline nasal spray several times daily and saline  nasal irrigation (AYR is a common brand).  Use Flonase nasal spray each morning after using Afrin nasal spray and saline nasal irrigation. Try warm salt water gargles for sore throat.  Stop all antihistamines for now, and other non-prescription cough/cold preparations.     Final Clinical Impressions(s) / UC Diagnoses   Final  diagnoses:  Viral URI with cough  Acute suppurative otitis media of both ears without spontaneous rupture of tympanic membranes, recurrence not specified    New Prescriptions New Prescriptions   AMOXICILLIN-CLAVULANATE (AUGMENTIN) 875-125 MG TABLET    Take 1 tablet by mouth 2 (two) times daily. Take with food   GUAIFENESIN-CODEINE 100-10 MG/5ML SYRUP    Take 10mL by mouth at bedtime as needed for cough   PREDNISONE (DELTASONE) 20 MG TABLET    Take one tab by mouth twice daily for 5 days, then one daily for 3 days. Take with food.     Lattie HawStephen A Saurabh Hettich, MD 04/26/16 [phone]

## 2016-04-26 NOTE — Discharge Instructions (Signed)
Take plain guaifenesin (1200mg extended release tabs such as Mucinex) twice daily, with plenty of water, for cough and congestion.  May add Pseudoephedrine (30mg, one or two every 4 to 6 hours) for sinus congestion.  Get adequate rest.   °May use Afrin nasal spray (or generic oxymetazoline) twice daily for about 5 days and then discontinue.  Also recommend using saline nasal spray several times daily and saline nasal irrigation (AYR is a common brand).  Use Flonase nasal spray each morning after using Afrin nasal spray and saline nasal irrigation. °Try warm salt water gargles for sore throat.  °Stop all antihistamines for now, and other non-prescription cough/cold preparations. °  °  °

## 2016-05-17 ENCOUNTER — Encounter: Payer: Self-pay | Admitting: Physician Assistant

## 2016-05-17 ENCOUNTER — Ambulatory Visit (INDEPENDENT_AMBULATORY_CARE_PROVIDER_SITE_OTHER): Payer: Federal, State, Local not specified - PPO | Admitting: Physician Assistant

## 2016-05-17 VITALS — BP 140/80 | HR 69 | Ht 67.0 in | Wt 228.0 lb

## 2016-05-17 DIAGNOSIS — H65193 Other acute nonsuppurative otitis media, bilateral: Secondary | ICD-10-CM

## 2016-05-17 MED ORDER — AZITHROMYCIN 250 MG PO TABS: ORAL_TABLET | ORAL | 0 refills | Status: DC

## 2016-05-17 NOTE — Patient Instructions (Addendum)
ayr nasal gel can help with nasal dryness.   Sports medicines for left foot pain.

## 2016-05-17 NOTE — Progress Notes (Addendum)
   Subjective:    Patient ID: Rachel [name], female    DOB: 04/27/1970, 46 y.o.   MRN: [id number]  HPI  Patient is a 46 year old caucasian female presenting for facial pain, ear pain, and nasal congeston. Patient [place] for similar symptoms and was prescribed augmentin, guaifenesin-codeine cough syrup, and prednisone. She reports finishing the antibiotic and continued use of the cough syrup, but states after a few days of taking prednisone she discontinued use due to insomnia and increased irritability. She states her ear pain has remained unchanged since initiation of antibiotics and has since developed right sided facial pain with associated headache. Patient communicates she has a history of not responding well to Augmentin and prefers Azithromycin. She also reports fatigue, chills, and a self-reported fever x 4 days.  Review of Systems  Constitutional: Positive for chills, fatigue and fever. Negative for appetite change.  HENT: Positive for congestion, ear pain, postnasal drip, rhinorrhea and sinus pressure. Negative for sore throat.   Respiratory: Positive for cough. Negative for chest tightness, shortness of breath and wheezing.   Cardiovascular: Negative for chest pain and palpitations.  Musculoskeletal: Negative for arthralgias and myalgias.   Past Medical History:  Diagnosis Date  . Allergy   . Obesity     Current Outpatient Prescriptions:  .  fexofenadine (ALLEGRA) 180 MG tablet, Take 180 mg by mouth daily., Disp: , Rfl:  .  azithromycin (ZITHROMAX) 250 MG tablet, Take 2 tablets now and then one tablet for 4 days., Disp: 6 each, Rfl: 0  Family History  Problem Relation Age of Onset  . Hypertension Father   . Heart attack Maternal Grandfather   . Heart attack Maternal Uncle   . Diabetes Maternal Aunt    .   Objective:   Physical Exam  Constitutional: She appears well-developed and well-nourished.  HENT:  Head: Normocephalic and  atraumatic.  Right Ear: Tympanic membrane is erythematous. Tympanic membrane is not perforated.  Left Ear: Tympanic membrane normal.  Nose: Rhinorrhea present. Right sinus exhibits maxillary sinus tenderness.  Mouth/Throat: Uvula is midline. No oropharyngeal exudate or posterior oropharyngeal erythema.  Cardiovascular: Normal rate and normal heart sounds.   Pulmonary/Chest: Breath sounds normal. No respiratory distress. She has no wheezes. She has no rales.  Lymphadenopathy:       Head (right side): No submental, no submandibular, no tonsillar, no preauricular, no posterior auricular and no occipital adenopathy present.       Head (left side): No submental, no submandibular, no tonsillar, no preauricular, no posterior auricular and no occipital adenopathy present.     Assessment & Plan:   Marland Kitchen.Marland Kitchen.Diagnoses and all orders for this visit:  Acute nonsuppurative otitis media, bilateral -     azithromycin (ZITHROMAX) 250 MG tablet; Take 2 tablets now and then one tablet for 4 days.  Supportive care discussed with patient including nasal spray, hydration, and tylenol as needed for fevers.

## 2016-11-25 ENCOUNTER — Ambulatory Visit (INDEPENDENT_AMBULATORY_CARE_PROVIDER_SITE_OTHER): Payer: Federal, State, Local not specified - PPO | Admitting: Family Medicine

## 2016-11-25 ENCOUNTER — Ambulatory Visit (INDEPENDENT_AMBULATORY_CARE_PROVIDER_SITE_OTHER): Payer: Federal, State, Local not specified - PPO

## 2016-11-25 ENCOUNTER — Encounter: Payer: Self-pay | Admitting: Family Medicine

## 2016-11-25 VITALS — BP 130/60 | HR 75 | Wt 225.0 lb

## 2016-11-25 DIAGNOSIS — M25562 Pain in left knee: Secondary | ICD-10-CM | POA: Diagnosis not present

## 2016-11-25 DIAGNOSIS — M25561 Pain in right knee: Secondary | ICD-10-CM

## 2016-11-25 DIAGNOSIS — M21969 Unspecified acquired deformity of unspecified lower leg: Secondary | ICD-10-CM | POA: Diagnosis not present

## 2016-11-25 DIAGNOSIS — M79641 Pain in right hand: Secondary | ICD-10-CM

## 2016-11-25 LAB — CBC WITH DIFFERENTIAL/PLATELET
BASOS PCT: 1 %
Basophils Absolute: 60 cells/uL (ref 0–200)
EOS ABS: 60 {cells}/uL (ref 15–500)
Eosinophils Relative: 1 %
HEMATOCRIT: 39.2 % (ref 35.0–45.0)
HEMOGLOBIN: 13.3 g/dL (ref 11.7–15.5)
LYMPHS ABS: 1980 {cells}/uL (ref 850–3900)
Lymphocytes Relative: 33 %
MCH: 31.1 pg (ref 27.0–33.0)
MCHC: 33.9 g/dL (ref 32.0–36.0)
MCV: 91.6 fL (ref 80.0–100.0)
MPV: 9.4 fL (ref 7.5–12.5)
Monocytes Absolute: 360 cells/uL (ref 200–950)
Monocytes Relative: 6 %
Neutro Abs: 3540 cells/uL (ref 1500–7800)
Neutrophils Relative %: 59 %
Platelets: 405 10*3/uL — ABNORMAL HIGH (ref 140–400)
RBC: 4.28 MIL/uL (ref 3.80–5.10)
RDW: 14.2 % (ref 11.0–15.0)
WBC: 6 10*3/uL (ref 3.8–10.8)

## 2016-11-25 MED ORDER — DICLOFENAC SODIUM 1 % TD GEL: 4.0000 g | Freq: Four times a day (QID) | TRANSDERMAL | 11 refills | Status: DC

## 2016-11-25 NOTE — Patient Instructions (Signed)
Thank you for coming in today. Get labs today.  Use voltaren gel on the hand and knees.  Use metatarsal pads on the left foot.  You can buy them at [email or web address]  Recheck with me in 1 month to follow up pain.   Bring you orthotics and several pairs of shoes with you.

## 2016-11-25 NOTE — Progress Notes (Signed)
Subjective:    I'm seeing this patient as a consultation for:  Rachel [name], Rachel L, PA-C   CC: Right hand pain, right knee pain, left foot pain.  HPI:  Right hand pain: Patient is a 6 month history of pain in the right hand especially the first second and third MCPs. These will swell and become stiff especially worse with activity. She notes symptoms do improve with rest. She's tried ibuprofen which does help. She thinks her grandmother may have had rheumatoid arthritis. She is worried about this diagnosis. She denies any injury.  Right knee pain: Patient has right knee pain ongoing now for over a year. She denies any injury but notes any swelling and pain worse with activity and better with rest. She denies significant locking or catching or giving way. Again this pain does improve with ibuprofen and some. The pain does interfere with her quality of life and makes it harder for her to walk normally at times.  Left foot pain: Patient has pain at the plantar forefoot. She notes the pain is worse with standing especially worse with wearing heels. She has tried some over-the-counter medications for pain which have helped. She's been evaluated for this in the past and thought to have Morton's neuroma metatarsalgia. She currently is not using any specific treatment.  Past medical history, Surgical history, Family history not pertinant except as noted below, Social history, Allergies, and medications have been entered into the medical record, reviewed, and no changes needed.   Review of Systems: No headache, visual changes, nausea, vomiting, diarrhea, constipation, dizziness, abdominal pain, skin rash, fevers, chills, night sweats, weight loss, swollen lymph nodes, body aches, joint swelling, muscle aches, chest pain, shortness of breath, mood changes, visual or auditory hallucinations.   Objective:    Vitals:   11/25/16 0917  BP: 130/60  Pulse: 75   General: Well Developed, well nourished, and  in no acute distress.  Neuro/Psych: Alert and oriented x3, extra-ocular muscles intact, able to move all 4 extremities, sensation grossly intact. Skin: Warm and dry, no rashes noted.  Respiratory: Not using accessory muscles, speaking in full sentences, trachea midline.  Cardiovascular: Pulses palpable, no extremity edema. Abdomen: Does not appear distended. MSK:  Right hand reasonably normal-appearing with no significant effusion. No deformities. No erythema. Nontender normal motion. Pulses capillary refill sensation and strength are intact.  Right knee: Normal-appearing no effusion no erythema. Tender palpation medial joint line. Positive McMurray's test with positive pop with motion. No locking palpated. Stable ligamentous exam. Intact flexion and extension strength.  Left foot callus formation at the plantar second and third metatarsal heads otherwise normal-appearing Pulses capillary refill and sensation intact. Tender to palpation at the second and third metatarsal heads. Negative squeeze test    No results found for this or any previous visit (from the past 24 hour(s)). Dg Knee 1-2 Views Left  Result Date: 11/25/2016 CLINICAL DATA:  Pain. EXAM: LEFT KNEE - 1-2 VIEW COMPARISON:  No recent prior. FINDINGS: No acute bony or joint abnormality identified. No evidence of fracture dislocation. IMPRESSION: No acute abnormality. Electronically Signed   By: Maisie Fushomas  Register   On: 11/25/2016 10:03   Dg Knee Complete 4 Views Right  Result Date: 11/25/2016 CLINICAL DATA:  Pain.  No injury . EXAM: RIGHT KNEE - COMPLETE 4+ VIEW COMPARISON:  No recent . FINDINGS: No acute bony or joint abnormality identified. No evidence of fracture or dislocation. IMPRESSION: No acute abnormality. Electronically Signed   By: Maisie Fushomas  Register  On: 11/25/2016 10:03   Dg Hand Complete Right  Result Date: 11/25/2016 CLINICAL DATA:  Right hand pain.  No injury. EXAM: RIGHT HAND - COMPLETE 3+ VIEW COMPARISON:   No recent prior . FINDINGS: No acute bony or joint abnormality identified. No evidence of fracture or dislocation. IMPRESSION: No acute abnormality . Electronically Signed   By: Maisie Fus  Register   On: 11/25/2016 10:05    Impression and Recommendations:    Assessment and Plan: 46 y.o. female with  Right hand pain unclear etiology concerning for overuse versus early RA. Plan for rheumatologic workup listed below.. Additionally we'll treat with diclofenac gel and recheck in one month. Refer to hand physical therapy.  Right knee pain: Again unclear etiology. Patient has minimal degenerative changes on x-ray per my interpretation today. However exam findings concerning for a degenerative medial meniscus injury. We will lengthy discussion about options. Plan for trial of diclofenac gel and recheck in a month. If not better will likely proceed with injection at that time.  Foot pain: Very likely metatarsalgia plus or minus Morton's neuroma. Information metatarsal pad and recheck in about a month. Patient will bring her orthotics that time.   Orders Placed This Encounter  Procedures  . DG Knee 1-2 Views Left    Standing Status:   Future    Number of Occurrences:   1    Standing Expiration Date:   01/26/2018    Order Specific Question:   Reason for Exam (SYMPTOM  OR DIAGNOSIS REQUIRED)    Answer:   For use with right knee x-ray, bilateral AP and Rosenberg standing.    Order Specific Question:   Is the patient pregnant?    Answer:   No    Order Specific Question:   Preferred imaging location?    Answer:   [facility]  . DG Knee Complete 4 Views Right    Please include patellar sunrise, lateral, and weightbearing bilateral AP and bilateral rosenberg views    Standing Status:   Future    Number of Occurrences:   1    Standing Expiration Date:   01/25/2018    Order Specific Question:   Reason for exam:    Answer:   Please include patellar sunrise, lateral, and weightbearing bilateral AP  and bilateral rosenberg views    Comments:   Please include patellar sunrise, lateral, and weightbearing bilateral AP and bilateral rosenberg views    Order Specific Question:   Preferred imaging location?    Answer:   [facility]  . DG Hand Complete Right    Standing Status:   Future    Number of Occurrences:   1    Standing Expiration Date:   01/25/2018    Order Specific Question:   Reason for Exam (SYMPTOM  OR DIAGNOSIS REQUIRED)    Answer:   eval pain MCP 1-3. ?RA vs DJD    Order Specific Question:   Is patient pregnant?    Answer:   No    Order Specific Question:   Preferred imaging location?    Answer:   [facility]    Order Specific Question:   Radiology Contrast Protocol - do NOT remove file path    Answer:   [email or web address]  . Cyclic citrul peptide antibody, IgG  . CBC with Differential/Platelet  . Sedimentation rate  . ANA  . Uric acid  . Complement, total  . Rheumatoid factor  . Ambulatory referral to Physical Therapy    Referral Priority:   Routine  Referral Type:   Physical Medicine    Referral Reason:   Specialty Services Required    Requested Specialty:   Physical Therapy    Number of Visits Requested:   1   Meds ordered this encounter  Medications  . diclofenac sodium (VOLTAREN) 1 % GEL    Sig: Apply 4 g topically 4 (four) times daily. To affected joint.    Dispense:  100 g    Refill:  11    Discussed warning signs or symptoms. Please see discharge instructions. Patient expresses understanding.

## 2016-11-26 LAB — URIC ACID: URIC ACID, SERUM: 4.2 mg/dL (ref 2.5–7.0)

## 2016-11-26 LAB — RHEUMATOID FACTOR: Rhuematoid fact SerPl-aCnc: <14 IU/mL (ref <14)

## 2016-11-26 LAB — CYCLIC CITRUL PEPTIDE ANTIBODY, IGG: Cyclic Citrullin Peptide Ab: 19 Units

## 2016-11-26 LAB — ANA: ANA: NEGATIVE

## 2016-11-26 LAB — SEDIMENTATION RATE: Sed Rate: 5 mm/hr (ref 0–20)

## 2016-11-29 LAB — COMPLEMENT, TOTAL: Compl, Total (CH50): >60 U/mL — ABNORMAL HIGH (ref 31–60)

## 2016-12-27 ENCOUNTER — Ambulatory Visit: Payer: Federal, State, Local not specified - PPO | Admitting: Family Medicine

## 2017-01-05 ENCOUNTER — Ambulatory Visit (INDEPENDENT_AMBULATORY_CARE_PROVIDER_SITE_OTHER): Payer: Federal, State, Local not specified - PPO | Admitting: Physician Assistant

## 2017-01-05 ENCOUNTER — Encounter: Payer: Self-pay | Admitting: Physician Assistant

## 2017-01-05 VITALS — BP 136/73 | HR 64 | Temp 98.0°F | Ht 67.0 in | Wt 226.0 lb

## 2017-01-05 DIAGNOSIS — J0140 Acute pansinusitis, unspecified: Secondary | ICD-10-CM

## 2017-01-05 MED ORDER — AMOXICILLIN-POT CLAVULANATE 875-125 MG PO TABS: 1.0000 | ORAL_TABLET | Freq: Two times a day (BID) | ORAL | 0 refills | Status: DC

## 2017-01-05 NOTE — Progress Notes (Signed)
   Subjective:    Patient ID: Rachel [name], female    DOB: 04-15-1970, 46 y.o.   MRN: [id number]  HPI Pt is a 46 yo female who presents to the clinic with 1 month of sinus pressure, daily headache. She started with cold like symptoms a month ago. She denies any cough, ear pain, ST but now just facial pressure and headache. She is taking tylenol cold sinus severe with no relief. Denies any fever, chills, cough, SOB.   Marland Kitchen. Active Ambulatory Problems    Diagnosis Date Noted  . Morton's neuroma of left foot 03/10/2015  . Metatarsal deformity 03/10/2015  . Plantar fasciitis of left foot 03/10/2015  . Pronation deformity of both feet 04/10/2015   Resolved Ambulatory Problems    Diagnosis Date Noted  . No Resolved Ambulatory Problems   Past Medical History:  Diagnosis Date  . Allergy   . Obesity       Review of Systems  All other systems reviewed and are negative.      Objective:   Physical Exam  Constitutional: She appears well-developed and well-nourished.  HENT:  Head: Normocephalic and atraumatic.  TM's erythematous.  Bilateral frontal and maxillary sinus tenderness to palpation.  oropharyx erythematous with PND.  Nasal turbinates red and swollen.   Eyes: Conjunctivae are normal. Right eye exhibits no discharge. Left eye exhibits no discharge.  Neck: Normal range of motion. Neck supple.  Cardiovascular: Normal rate, regular rhythm and normal heart sounds.   Pulmonary/Chest: Effort normal and breath sounds normal.  Lymphadenopathy:    She has cervical adenopathy.          Assessment & Plan:  Marland KitchenMarland KitchenSurya was seen today for headache, sinus pressure and otalgia.  Diagnoses and all orders for this visit:  Acute non-recurrent pansinusitis -     amoxicillin-clavulanate (AUGMENTIN) 875-125 MG tablet; Take 1 tablet by mouth 2 (two) times daily.   Discussed symptomatic care. Started augmentin. Follow up as needed.

## 2017-01-05 NOTE — Patient Instructions (Signed)

## 2017-01-06 ENCOUNTER — Encounter: Payer: Self-pay | Admitting: Physician Assistant

## 2017-04-19 DIAGNOSIS — K080 Exfoliation of teeth due to systemic causes: Secondary | ICD-10-CM | POA: Diagnosis not present

## 2017-06-05 ENCOUNTER — Telehealth: Payer: Federal, State, Local not specified - PPO | Admitting: Physician Assistant

## 2017-06-05 DIAGNOSIS — R6889 Other general symptoms and signs: Secondary | ICD-10-CM

## 2017-06-05 MED ORDER — OSELTAMIVIR PHOSPHATE 75 MG PO CAPS: 75.0000 mg | ORAL_CAPSULE | Freq: Two times a day (BID) | ORAL | 0 refills | Status: DC

## 2017-06-05 NOTE — Progress Notes (Signed)

## 2017-08-18 ENCOUNTER — Ambulatory Visit (INDEPENDENT_AMBULATORY_CARE_PROVIDER_SITE_OTHER): Payer: Federal, State, Local not specified - PPO | Admitting: Obstetrics & Gynecology

## 2017-08-18 ENCOUNTER — Encounter: Payer: Self-pay | Admitting: Obstetrics & Gynecology

## 2017-08-18 VITALS — BP 122/73 | HR 76 | Ht 67.0 in | Wt 236.0 lb

## 2017-08-18 DIAGNOSIS — Z124 Encounter for screening for malignant neoplasm of cervix: Secondary | ICD-10-CM

## 2017-08-18 DIAGNOSIS — Z1151 Encounter for screening for human papillomavirus (HPV): Secondary | ICD-10-CM

## 2017-08-18 DIAGNOSIS — Z01419 Encounter for gynecological examination (general) (routine) without abnormal findings: Secondary | ICD-10-CM | POA: Diagnosis not present

## 2017-08-18 DIAGNOSIS — N926 Irregular menstruation, unspecified: Secondary | ICD-10-CM | POA: Diagnosis not present

## 2017-08-18 NOTE — Progress Notes (Signed)
Subjective:    Rachel [name] is a 47 y.o. married P2 (26 and 43 yo kids) female who presents for an annual exam. She is concerned about weight gain. She has gained about 20#. She had lost down to 200 but regained.  Seh is having night sweats. The patient is sexually active. GYN screening history: last pap: was normal. The patient wears seatbelts: yes. The patient participates in regular exercise: no. Has the patient ever been transfused or tattooed?: no. The patient reports that there is not domestic violence in her life.   Menstrual History: OB History    Gravida  2   Para  2   Term  2   Preterm      AB      Living  2     SAB      TAB      Ectopic      Multiple      Live Births              Menarche age: 75 Patient's last menstrual period was 08/03/2017.    The following portions of the patient's history were reviewed and updated as appropriate: allergies, current medications, past family history, past medical history, past social history, past surgical history and problem list.  Review of Systems Pertinent items are noted in HPI.   Married for 26 years, some dryness Her husband had a vasectomy FH- no breast, gyn, colon cancer   Objective:    BP 122/73   Pulse 76   Ht  (1.702 m)   Wt 236 lb (107 kg)   LMP 08/03/2017   BMI 36.96 kg/m   General Appearance:    Alert, cooperative, no distress, appears stated age  Head:    Normocephalic, without obvious abnormality, atraumatic  Eyes:    PERRL, conjunctiva/corneas clear, EOM's intact, fundi    benign, both eyes  Ears:    Normal TM's and external ear canals, both ears  Nose:   Nares normal, septum midline, mucosa normal, no drainage    or sinus tenderness  Throat:   Lips, mucosa, and tongue normal; teeth and gums normal  Neck:   Supple, symmetrical, trachea midline, no adenopathy;    thyroid:  no enlargement/tenderness/nodules; no carotid   bruit or JVD  Back:     Symmetric, no curvature, ROM normal, no  CVA tenderness  Lungs:     Clear to auscultation bilaterally, respirations unlabored  Chest Wall:    No tenderness or deformity   Heart:    Regular rate and rhythm, S1 and S2 normal, no murmur, rub   or gallop  Breast Exam:    No tenderness, masses, or nipple abnormality  Abdomen:     Soft, non-tender, bowel sounds active all four quadrants,    no masses, no organomegaly  Genitalia:    Normal female without lesion, discharge or tenderness, normal size and shape, anteverted, mobile, non-tender, normal adnexal exam      Extremities:   Extremities normal, atraumatic, no cyanosis or edema  Pulses:   2+ and symmetric all extremities  Skin:   Skin color, texture, turgor normal, no rashes or lesions  Lymph nodes:   Cervical, supraclavicular, and axillary nodes normal  Neurologic:   CNII-XII intact, normal strength, sensation and reflexes    throughout  .    Assessment:    Healthy female exam.   Skipping periods Weight gain   Plan:     Thin prep Pap smear.  with cotesting  Mammogram Check fasting labs tomorrow [facility] Refer to bariatrics

## 2017-08-18 NOTE — Progress Notes (Signed)
PT wants to discuss weight gain

## 2017-08-19 ENCOUNTER — Telehealth: Payer: Self-pay

## 2017-08-19 ENCOUNTER — Encounter: Payer: Self-pay | Admitting: Physician Assistant

## 2017-08-19 ENCOUNTER — Encounter (INDEPENDENT_AMBULATORY_CARE_PROVIDER_SITE_OTHER): Payer: Self-pay

## 2017-08-19 ENCOUNTER — Other Ambulatory Visit: Payer: Federal, State, Local not specified - PPO

## 2017-08-19 ENCOUNTER — Ambulatory Visit (INDEPENDENT_AMBULATORY_CARE_PROVIDER_SITE_OTHER): Payer: Federal, State, Local not specified - PPO | Admitting: Physician Assistant

## 2017-08-19 VITALS — BP 131/66 | HR 63 | Temp 98.5°F | Wt 238.2 lb

## 2017-08-19 DIAGNOSIS — Z6837 Body mass index (BMI) 37.0-37.9, adult: Secondary | ICD-10-CM

## 2017-08-19 DIAGNOSIS — N926 Irregular menstruation, unspecified: Secondary | ICD-10-CM | POA: Diagnosis not present

## 2017-08-19 DIAGNOSIS — E6609 Other obesity due to excess calories: Secondary | ICD-10-CM

## 2017-08-19 DIAGNOSIS — Z6836 Body mass index (BMI) 36.0-36.9, adult: Secondary | ICD-10-CM | POA: Insufficient documentation

## 2017-08-19 DIAGNOSIS — Z0000 Encounter for general adult medical examination without abnormal findings: Secondary | ICD-10-CM

## 2017-08-19 DIAGNOSIS — Z01419 Encounter for gynecological examination (general) (routine) without abnormal findings: Secondary | ICD-10-CM | POA: Diagnosis not present

## 2017-08-19 MED ORDER — PHENTERMINE HCL 37.5 MG PO TABS: 37.5000 mg | ORAL_TABLET | Freq: Every day | ORAL | 0 refills | Status: DC

## 2017-08-19 NOTE — Telephone Encounter (Signed)
Spoke to [employer] to add on add'l lab orders for B-12/ vitamin D testing w/ dx code Z00.00. As per Rep, unable to add on vitamin D because it would be duplicate testing. Vitamin B-12 results will be available next week.

## 2017-08-19 NOTE — Progress Notes (Addendum)
1 Subjective:     Rachel [name] is a 47 y.o. female and is here for a comprehensive physical exam. The patient reports problems - her only concern is her weight. labs were ordered at GYN office for TSH. she is not exercising or dieting. gained 10lbs in last year.   Social History   Socioeconomic History  . Marital status: Married    Spouse name: Not on file  . Number of children: Not on file  . Years of education: Not on file  . Highest education level: Not on file  Occupational History  . Occupation: spanish interpreter  Social Needs  . Financial resource strain: Not on file  . Food insecurity:    Worry: Not on file    Inability: Not on file  . Transportation needs:    Medical: Not on file    Non-medical: Not on file  Tobacco Use  . Smoking status: Former Smoker    Last attempt to quit: 09/01/1995    Years since quitting: 21.9  . Smokeless tobacco: Never Used  Substance and Sexual Activity  . Alcohol use: No    Alcohol/week: 0.0 oz  . Drug use: No  . Sexual activity: Yes    Partners: Male    Birth control/protection: Other-see comments    Comment: spouse had vasectomy  Lifestyle  . Physical activity:    Days per week: Not on file    Minutes per session: Not on file  . Stress: Not on file  Relationships  . Social connections:    Talks on phone: Not on file    Gets together: Not on file    Attends religious service: Not on file    Active member of club or organization: Not on file    Attends meetings of clubs or organizations: Not on file    Relationship status: Not on file  . Intimate partner violence:    Fear of current or ex partner: Not on file    Emotionally abused: Not on file    Physically abused: Not on file    Forced sexual activity: Not on file  Other Topics Concern  . Not on file  Social History Narrative  . Not on file   Health Maintenance  Topic Date Due  . MAMMOGRAM  08/31/2014  . PAP SMEAR  08/23/2016  . INFLUENZA VACCINE  01/05/2018  (Originally 11/10/2017)  . HIV Screening  08/20/2018 (Originally 12/25/1985)  . TETANUS/TDAP  12/01/2024    The following portions of the patient's history were reviewed and updated as appropriate: allergies, current medications, past family history, past medical history, past social history, past surgical history and problem list.  Review of Systems Pertinent items noted in HPI and remainder of comprehensive ROS otherwise negative.   Objective:    BP 131/66 (BP Location: Right Arm, Patient Position: Sitting, Cuff Size: Large)   Pulse 63   Temp 98.5 F (36.9 C) (Oral)   Wt 238 lb 3.2 oz (108 kg)   LMP 08/03/2017   BMI 37.31 kg/m  General appearance: alert, cooperative and appears stated age Head: Normocephalic, without obvious abnormality, atraumatic Eyes: conjunctivae/corneas clear. PERRL, EOM's intact. Fundi benign. Ears: normal TM's and external ear canals both ears Nose: Nares normal. Septum midline. Mucosa normal. No drainage or sinus tenderness. Throat: lips, mucosa, and tongue normal; teeth and gums normal Neck: no adenopathy, no carotid bruit, no JVD, supple, symmetrical, trachea midline and thyroid not enlarged, symmetric, no tenderness/mass/nodules Back: symmetric, no curvature. ROM normal. No CVA  tenderness. Lungs: clear to auscultation bilaterally Heart: regular rate and rhythm, S1, S2 normal, no murmur, click, rub or gallop Abdomen: soft, non-tender; bowel sounds normal; no masses,  no organomegaly Extremities: extremities normal, atraumatic, no cyanosis or edema Pulses: 2+ and symmetric Skin: Skin color, texture, turgor normal. No rashes or lesions Lymph nodes: Cervical, supraclavicular, and axillary nodes normal. Neurologic: Alert and oriented X 3, normal strength and tone. Normal symmetric reflexes. Normal coordination and gait    Assessment:    Healthy female exam.      Plan:    Marland KitchenMarland KitchenSherell was seen today for follow-up and annual exam.  Diagnoses and all orders  for this visit:  Routine physical examination  Class 2 obesity due to excess calories without serious comorbidity with body mass index (BMI) of 37.0 to 37.9 in adult -     phentermine (ADIPEX-P) 37.5 MG tablet; Take 1 tablet (37.5 mg total) by mouth daily before breakfast.   .. Depression screen [facility] 2/9 08/19/2017 01/05/2017  Decreased Interest 0 0  Down, Depressed, Hopeless 0 0  PHQ - 2 Score 0 0  Altered sleeping 0 -  Tired, decreased energy 0 -  Change in appetite 0 -  Feeling bad or failure about yourself  0 -  Trouble concentrating 0 -  Moving slowly or fidgety/restless 0 -  Suicidal thoughts 0 -  PHQ-9 Score 0 -  Difficult doing work/chores Not difficult at all -   .. Discussed 150 minutes of exercise a week.  Encouraged vitamin D 1000 units and Calcium  or 4 servings of dairy a day.  Mammogram and pap up to date.   Marland Kitchen.Discussed low carb diet with 1500 calories and 80g of protein.  Exercising at least 150 minutes a week.  [employer] could be a Chief Technology Officer.  Discussed medications options. Pt is concerned with cost of other medications. Started phentermine. Discussed side effects. Follow up in 4 weeks.   Awaiting labs results. Added b12 to labs.  See After Visit Summary for Counseling Recommendations

## 2017-08-19 NOTE — Telephone Encounter (Signed)
Thanks

## 2017-08-19 NOTE — Patient Instructions (Signed)

## 2017-08-22 LAB — CYTOLOGY - PAP
Diagnosis: NEGATIVE
HPV: NOT DETECTED

## 2017-08-24 LAB — CBC
HCT: 39.7 % (ref 35.0–45.0)
HEMOGLOBIN: 13.5 g/dL (ref 11.7–15.5)
MCH: 29.3 pg (ref 27.0–33.0)
MCHC: 34 g/dL (ref 32.0–36.0)
MCV: 86.1 fL (ref 80.0–100.0)
MPV: 9.3 fL (ref 7.5–12.5)
Platelets: 346 10*3/uL (ref 140–400)
RBC: 4.61 10*6/uL (ref 3.80–5.10)
RDW: 13.6 % (ref 11.0–15.0)
WBC: 6.9 10*3/uL (ref 3.8–10.8)

## 2017-08-24 LAB — COMPREHENSIVE METABOLIC PANEL
AG RATIO: 1.7 (calc) (ref 1.0–2.5)
ALKALINE PHOSPHATASE (APISO): 107 U/L (ref 33–115)
ALT: 21 U/L (ref 6–29)
AST: 17 U/L (ref 10–35)
Albumin: 4.2 g/dL (ref 3.6–5.1)
BILIRUBIN TOTAL: 0.4 mg/dL (ref 0.2–1.2)
BUN: 11 mg/dL (ref 7–25)
CALCIUM: 9.6 mg/dL (ref 8.6–10.2)
CHLORIDE: 105 mmol/L (ref 98–110)
CO2: 24 mmol/L (ref 20–32)
Creat: 0.71 mg/dL (ref 0.50–1.10)
GLOBULIN: 2.5 g/dL (ref 1.9–3.7)
Glucose, Bld: 92 mg/dL (ref 65–99)
Potassium: 4.4 mmol/L (ref 3.5–5.3)
Sodium: 139 mmol/L (ref 135–146)
Total Protein: 6.7 g/dL (ref 6.1–8.1)

## 2017-08-24 LAB — LIPID PANEL
Cholesterol: 157 mg/dL (ref <200)
HDL: 66 mg/dL (ref >50)
LDL CHOLESTEROL (CALC): 71 mg/dL
Non-HDL Cholesterol (Calc): 91 mg/dL (calc) (ref <130)
TRIGLYCERIDES: 113 mg/dL (ref <150)
Total CHOL/HDL Ratio: 2.4 (calc) (ref <5.0)

## 2017-08-24 LAB — VITAMIN B12: VITAMIN B 12: 372 pg/mL (ref 200–1100)

## 2017-08-24 LAB — FOLLICLE STIMULATING HORMONE: FSH: 5.7 m[IU]/mL

## 2017-08-24 LAB — VITAMIN D 25 HYDROXY (VIT D DEFICIENCY, FRACTURES): VIT D 25 HYDROXY: 31 ng/mL (ref 30–100)

## 2017-08-24 LAB — TSH: TSH: 1.08 mIU/L

## 2017-09-05 ENCOUNTER — Emergency Department
Admission: EM | Admit: 2017-09-05 | Discharge: 2017-09-05 | Disposition: A | Discharge status: Home / Self Care | Payer: Federal, State, Local not specified - PPO | Source: Home / Self Care

## 2017-09-05 ENCOUNTER — Encounter: Payer: Self-pay | Admitting: Family Medicine

## 2017-09-05 ENCOUNTER — Other Ambulatory Visit: Payer: Self-pay

## 2017-09-05 DIAGNOSIS — L237 Allergic contact dermatitis due to plants, except food: Secondary | ICD-10-CM | POA: Diagnosis not present

## 2017-09-05 DIAGNOSIS — H65113 Acute and subacute allergic otitis media (mucoid) (sanguinous) (serous), bilateral: Secondary | ICD-10-CM

## 2017-09-05 MED ORDER — METHYLPREDNISOLONE ACETATE 80 MG/ML IJ SUSP
80.0000 mg | Freq: Once | INTRAMUSCULAR | Status: AC
  Administered 2017-09-05: 80 mg via INTRAMUSCULAR

## 2017-09-05 MED ORDER — AMOXICILLIN 875 MG PO TABS: 875.0000 mg | ORAL_TABLET | Freq: Two times a day (BID) | ORAL | 0 refills | Status: DC

## 2017-09-05 NOTE — ED Triage Notes (Signed)
Pt was clearing out some woods about 1 week ago, then developed a rash.  Has had nasal congestion and bilat ear pressure for about 4 days.

## 2017-09-05 NOTE — ED Provider Notes (Signed)
[facility]   [id number] 09/05/17 Arrival Time: 1354   SUBJECTIVE:  Rachel [name] is a 47 y.o. female who presents to the urgent care with complaint of rash, which patient believes is poison ivy which she contracted when she was out in the woods 1 week ago.  He has had progressive itching in multiple areas including her abdomen, both arms, both legs.  Patient is also had fullness in her ears with some discomfort in the right over the last 4 days.  Her nose is started running but she has had no sore throat, or cough, or gastrointestinal symptoms.     Past Medical History:  Diagnosis Date  . Allergy   . Obesity    Family History  Problem Relation Age of Onset  . Hypertension Father   . Heart attack Maternal Grandfather   . Heart attack Maternal Uncle   . Diabetes Maternal Aunt    Social History   Socioeconomic History  . Marital status: Married    Spouse name: Not on file  . Number of children: Not on file  . Years of education: Not on file  . Highest education level: Not on file  Occupational History  . Occupation: spanish interpreter  Social Needs  . Financial resource strain: Not on file  . Food insecurity:    Worry: Not on file    Inability: Not on file  . Transportation needs:    Medical: Not on file    Non-medical: Not on file  Tobacco Use  . Smoking status: Former Smoker    Last attempt to quit: 09/01/1995    Years since quitting: 22.0  . Smokeless tobacco: Never Used  Substance and Sexual Activity  . Alcohol use: No    Alcohol/week: 0.0 oz  . Drug use: No  . Sexual activity: Yes    Partners: Male    Birth control/protection: Other-see comments    Comment: spouse had vasectomy  Lifestyle  . Physical activity:    Days per week: Not on file    Minutes per session: Not on file  . Stress: Not on file  Relationships  . Social connections:    Talks on phone: Not on file    Gets together: Not on file    Attends religious service: Not on file      Active member of club or organization: Not on file    Attends meetings of clubs or organizations: Not on file    Relationship status: Not on file  . Intimate partner violence:    Fear of current or ex partner: Not on file    Emotionally abused: Not on file    Physically abused: Not on file    Forced sexual activity: Not on file  Other Topics Concern  . Not on file  Social History Narrative  . Not on file   No outpatient medications have been marked as taking for the 09/05/17 encounter [facility] Encounter).   No Known Allergies    ROS: As per HPI, remainder of ROS negative.   OBJECTIVE:   Vitals:   09/05/17 1414 09/05/17 1415  BP: 119/75   Pulse: 65   Temp: 98.2 F (36.8 C)   TempSrc: Oral   SpO2: 99%   Weight:  233 lb (105.7 kg)  Height:   (1.702 m)     General appearance: alert; no distress Eyes: PERRL; EOMI; conjunctiva normal HENT: normocephalic; atraumatic; TMs reddened on the right with some bulging and bulging on the left with  mucoid appearing fluid on the inside of the TM, canal normal, external ears normal without trauma; nasal mucosa normal; oral mucosa normal Neck: supple Lungs: clear to auscultation bilaterally Heart: regular rate and rhythm Back: no CVA tenderness Extremities: no cyanosis or edema; symmetrical with no gross deformities Skin: warm and dry; multiple small vesicles and papules and streaks on 4 extremities and abdomen Neurologic: normal gait; grossly normal Psychological: alert and cooperative; normal mood and affect      Labs:  Results for orders placed or performed in visit on 08/18/17  Lipid panel  Result Value Ref Range   Cholesterol 157 <200 mg/dL   HDL 66 >16 mg/dL   Triglycerides 109 <604 mg/dL   LDL Cholesterol (Calc) 71 mg/dL (calc)   Total CHOL/HDL Ratio 2.4 <5.0 (calc)   Non-HDL Cholesterol (Calc) 91 <540 mg/dL (calc)  TSH  Result Value Ref Range   TSH 1.08 mIU/L  CBC  Result Value Ref Range   WBC 6.9 3.8  - 10.8 Thousand/uL   RBC 4.61 3.80 - 5.10 Million/uL   Hemoglobin 13.5 11.7 - 15.5 g/dL   HCT 98.1 19.1 - 47.8 %   MCV 86.1 80.0 - 100.0 fL   MCH 29.3 27.0 - 33.0 pg   MCHC 34.0 32.0 - 36.0 g/dL   RDW 29.5 62.1 - 30.8 %   Platelets 346 140 - 400 Thousand/uL   MPV 9.3 7.5 - 12.5 fL  Comprehensive metabolic panel  Result Value Ref Range   Glucose, Bld 92 65 - 99 mg/dL   BUN 11 7 - 25 mg/dL   Creat 6.57 8.46 - 9.62 mg/dL   BUN/Creatinine Ratio NOT APPLICABLE 6 - 22 (calc)   Sodium 139 135 - 146 mmol/L   Potassium 4.4 3.5 - 5.3 mmol/L   Chloride 105 98 - 110 mmol/L   CO2 24 20 - 32 mmol/L   Calcium 9.6 8.6 - 10.2 mg/dL   Total Protein 6.7 6.1 - 8.1 g/dL   Albumin 4.2 3.6 - 5.1 g/dL   Globulin 2.5 1.9 - 3.7 g/dL (calc)   AG Ratio 1.7 1.0 - 2.5 (calc)   Total Bilirubin 0.4 0.2 - 1.2 mg/dL   Alkaline phosphatase (APISO) 107 33 - 115 U/L   AST 17 10 - 35 U/L   ALT 21 6 - 29 U/L  Vitamin D (25 hydroxy)  Result Value Ref Range   Vit D, 25-Hydroxy 31 30 - 100 ng/mL  FSH  Result Value Ref Range   FSH 5.7 mIU/mL  Vitamin B12  Result Value Ref Range   Vitamin B-12 372 200 - 1,100 pg/mL  Cytology - PAP  Result Value Ref Range   Adequacy      Satisfactory for evaluation  endocervical/transformation zone component PRESENT.   Diagnosis      NEGATIVE FOR INTRAEPITHELIAL LESIONS OR MALIGNANCY.   HPV NOT DETECTED    Material Submitted CervicoVaginal Pap [ThinPrep Imaged]    CYTOLOGY - PAP PAP RESULT     Labs Reviewed - No data to display  No results found.     ASSESSMENT & PLAN:  1. Poison ivy   2. Acute mucoid otitis media of both ears     Meds ordered this encounter  Medications  . methylPREDNISolone acetate (DEPO-MEDROL) injection 80 mg  . amoxicillin (AMOXIL) 875 MG tablet    Sig: Take 1 tablet (875 mg total) by mouth 2 (two) times daily.    Dispense:  10 tablet    Refill:  0  Reviewed expectations re: course of current medical issues. Questions  answered. Outlined signs and symptoms indicating need for more acute intervention. Patient verbalized understanding. After Visit Summary given.      Elvina Sidle, MD 09/05/17 1429

## 2017-09-19 ENCOUNTER — Ambulatory Visit: Payer: Federal, State, Local not specified - PPO | Admitting: Physician Assistant

## 2017-09-23 ENCOUNTER — Encounter: Payer: Self-pay | Admitting: Physician Assistant

## 2017-09-23 ENCOUNTER — Ambulatory Visit: Payer: Federal, State, Local not specified - PPO | Admitting: Physician Assistant

## 2017-09-23 DIAGNOSIS — E6609 Other obesity due to excess calories: Secondary | ICD-10-CM | POA: Diagnosis not present

## 2017-09-23 DIAGNOSIS — Z6836 Body mass index (BMI) 36.0-36.9, adult: Secondary | ICD-10-CM

## 2017-09-23 MED ORDER — PHENTERMINE HCL 37.5 MG PO TABS: 37.5000 mg | ORAL_TABLET | Freq: Every day | ORAL | 0 refills | Status: DC

## 2017-09-23 NOTE — Progress Notes (Signed)
   Subjective:    Patient ID: Rachel [name], female    DOB: July 08, 1970, 47 y.o.   MRN: [id number]  HPI  Pt is a 47 yo female who presents to the clinic to follow up on weight loss. She started phentermine 1 month ago. She is doing well and lost 8lbs. She is drinking only water. She is not exercising but working in her garden a lot. She has cut portion sizes in half. She noticed some problems sleeping at first. She did start some natural OTC deep sleep and working well. She is only taking 1/2 tablet currently. Denies any palpitations or increase in anxiety.   .. Active Ambulatory Problems    Diagnosis Date Noted  . Morton's neuroma of left foot 03/10/2015  . Metatarsal deformity 03/10/2015  . Plantar fasciitis of left foot 03/10/2015  . Pronation deformity of both feet 04/10/2015  . Class 2 obesity due to excess calories without serious comorbidity with body mass index (BMI) of 37.0 to 37.9 in adult 08/19/2017  . Allergic rhinitis 12/17/2010  . Premenstrual syndrome 12/17/2010  . Tonsil stone 12/17/2010   Resolved Ambulatory Problems    Diagnosis Date Noted  . No Resolved Ambulatory Problems   Past Medical History:  Diagnosis Date  . Allergy   . Obesity       Review of Systems  All other systems reviewed and are negative.      Objective:   Physical Exam  Constitutional: She is oriented to person, place, and time. She appears well-developed and well-nourished.  HENT:  Head: Normocephalic and atraumatic.  Cardiovascular: Normal rate and regular rhythm.  Pulmonary/Chest: Effort normal and breath sounds normal.  Neurological: She is alert and oriented to person, place, and time.  Psychiatric: She has a normal mood and affect. Her behavior is normal.          Assessment & Plan:  Marland Kitchen.Marland Kitchen.Rachel [name] was seen today for weight check.  Diagnoses and all orders for this visit:  Class 2 obesity due to excess calories without serious comorbidity with body mass index (BMI) of 36.0 to 36.9  in adult -     phentermine (ADIPEX-P) 37.5 MG tablet; Take 1 tablet (37.5 mg total) by mouth daily before breakfast.   Doing well. Continue on 1/2 tablet. Work on adding exercise. Refilled follow up nurse visit in 2 months. Goal to get BMI under 30.

## 2017-10-24 DIAGNOSIS — K080 Exfoliation of teeth due to systemic causes: Secondary | ICD-10-CM | POA: Diagnosis not present

## 2017-11-14 ENCOUNTER — Ambulatory Visit (INDEPENDENT_AMBULATORY_CARE_PROVIDER_SITE_OTHER): Payer: Federal, State, Local not specified - PPO | Admitting: Osteopathic Medicine

## 2017-11-14 DIAGNOSIS — Z6836 Body mass index (BMI) 36.0-36.9, adult: Secondary | ICD-10-CM

## 2017-11-14 DIAGNOSIS — E6609 Other obesity due to excess calories: Secondary | ICD-10-CM

## 2017-11-14 MED ORDER — PHENTERMINE HCL 37.5 MG PO TABS: 37.5000 mg | ORAL_TABLET | Freq: Every day | ORAL | 0 refills | Status: DC

## 2017-11-14 NOTE — Progress Notes (Signed)
   Subjective:    Patient ID: Rachel [name], female    DOB: [birth year], 47 y.o.   MRN: [id number]  HPI Patient is here for blood pressure and weight check. Denies trouble sleeping, palpitations, or medication problems.    Review of Systems     Objective:   Physical Exam        Assessment & Plan:  Patient has lost weight. A refill for phentermine pended to be completed. Pt continues taking 1/2 tablet and working on diet and exercise. Patient advised to schedule a follow up with nurse in 30 days. RX pended for MD to review and send.

## 2017-11-25 ENCOUNTER — Telehealth: Payer: Self-pay

## 2017-11-25 NOTE — Telephone Encounter (Signed)
Pt called looking to be seen today for a irritated sore throat for last couple weeks, head aches, and "just all around feeling bad".   I advised pt that we do not have any appts available in out office today but I did advise her to be seen at Urgent Care to be evaluated.   Pt agreeable and will be seen there. Will call and schedule f/u with provider here if needed

## 2017-12-08 DIAGNOSIS — K080 Exfoliation of teeth due to systemic causes: Secondary | ICD-10-CM | POA: Diagnosis not present

## 2017-12-13 ENCOUNTER — Ambulatory Visit (INDEPENDENT_AMBULATORY_CARE_PROVIDER_SITE_OTHER): Payer: Federal, State, Local not specified - PPO | Admitting: Physician Assistant

## 2017-12-13 VITALS — BP 121/67 | HR 71 | Wt 222.0 lb

## 2017-12-13 DIAGNOSIS — Z6836 Body mass index (BMI) 36.0-36.9, adult: Secondary | ICD-10-CM

## 2017-12-13 DIAGNOSIS — E6609 Other obesity due to excess calories: Secondary | ICD-10-CM | POA: Diagnosis not present

## 2017-12-13 MED ORDER — PHENTERMINE HCL 37.5 MG PO TABS
37.5000 mg | ORAL_TABLET | Freq: Every day | ORAL | 0 refills | Status: DC
Start: 2017-12-13 — End: 2018-01-13

## 2017-12-13 NOTE — Progress Notes (Signed)
   Subjective:    Patient ID: Rachel [name], female    DOB: 17-Nov-1970, 47 y.o.   MRN: [id number]  HPI  Rachel [name] is here for blood pressure and weight check. Diet and exercise is going well. Denies trouble sleeping or palpitations.    Review of Systems     Objective:   Physical Exam        Assessment & Plan:  Abnormal weight gain - Patient has lost weight. A refill on phentermine sent to pharmacy. Patient advised to schedule a follow up in 4 weeks.   Pt has lost 3lbs.   Agree with above plan. Tandy Gaw PA-C

## 2018-01-12 ENCOUNTER — Ambulatory Visit: Payer: Federal, State, Local not specified - PPO

## 2018-01-13 ENCOUNTER — Encounter: Payer: Self-pay | Admitting: Physician Assistant

## 2018-01-13 ENCOUNTER — Ambulatory Visit (INDEPENDENT_AMBULATORY_CARE_PROVIDER_SITE_OTHER): Payer: Federal, State, Local not specified - PPO | Admitting: Physician Assistant

## 2018-01-13 VITALS — BP 119/80 | HR 71 | Temp 98.2°F | Wt 220.0 lb

## 2018-01-13 DIAGNOSIS — Z6836 Body mass index (BMI) 36.0-36.9, adult: Secondary | ICD-10-CM

## 2018-01-13 DIAGNOSIS — E6609 Other obesity due to excess calories: Secondary | ICD-10-CM | POA: Diagnosis not present

## 2018-01-13 DIAGNOSIS — H01001 Unspecified blepharitis right upper eyelid: Secondary | ICD-10-CM | POA: Diagnosis not present

## 2018-01-13 DIAGNOSIS — H5789 Other specified disorders of eye and adnexa: Secondary | ICD-10-CM | POA: Diagnosis not present

## 2018-01-13 DIAGNOSIS — Q828 Other specified congenital malformations of skin: Secondary | ICD-10-CM | POA: Diagnosis not present

## 2018-01-13 MED ORDER — PHENTERMINE HCL 37.5 MG PO TABS: 37.5000 mg | ORAL_TABLET | Freq: Every day | ORAL | 0 refills | Status: DC

## 2018-01-13 MED ORDER — ERYTHROMYCIN 5 MG/GM OP OINT: 1.0000 "application " | TOPICAL_OINTMENT | Freq: Every day | OPHTHALMIC | 0 refills | Status: AC

## 2018-01-13 NOTE — Progress Notes (Signed)
Subjective:    Patient ID: Rachel [name], female    DOB: [birth year], 47 y.o.   MRN: [id number]  HPI Pt is a 47 yo female who presents to the clinic with 2 eye masses on right eye. She has had for last 3 years but feels like they are getting bigger. No pain. Her right eye is very irritated today. She feels like she could have scratched the one mass and irritated it some. No eye discharge. No vision changes. No URI symptoms. Not tried anything to make better.   She is on phentermine. She would like a refill. She has lost 2lbs. She is exercising and watching what she eats. She denies any side effects.   She does have other skin tags that bother her. They get caught on necklace and shaving armpits irritates tags.  .. Active Ambulatory Problems    Diagnosis Date Noted  . Morton's neuroma of left foot 03/10/2015  . Metatarsal deformity 03/10/2015  . Plantar fasciitis of left foot 03/10/2015  . Pronation deformity of both feet 04/10/2015  . Class 2 obesity due to excess calories without serious comorbidity with body mass index (BMI) of 36.0 to 36.9 in adult 08/19/2017  . Allergic rhinitis 12/17/2010  . Premenstrual syndrome 12/17/2010  . Tonsil stone 12/17/2010  . Mass of right eye 01/13/2018  . Blepharitis of right upper eyelid 01/13/2018   Resolved Ambulatory Problems    Diagnosis Date Noted  . No Resolved Ambulatory Problems   Past Medical History:  Diagnosis Date  . Allergy   . Obesity       Review of Systems See HPI.     Objective:   Physical Exam  Constitutional: She is oriented to person, place, and time. She appears well-developed and well-nourished.  HENT:  Head: Normocephalic and atraumatic.  Eyes:    Cardiovascular: Normal rate and regular rhythm.  Pulmonary/Chest: Effort normal.  Neurological: She is alert and oriented to person, place, and time.  Skin:  Multiple skin tags in bilateral axilla and around neck.   Psychiatric: She has a normal mood and affect.  Her behavior is normal.          Assessment & Plan:  Marland KitchenMarland KitchenTosha was seen today for skin tag.  Diagnoses and all orders for this visit:  Blepharitis of right upper eyelid, unspecified type -     erythromycin ophthalmic ointment; Place 1 application into the right eye at bedtime for 5 days. For 4 weeks. -     Ambulatory referral to Ophthalmology  Mass of right eye -     Ambulatory referral to Ophthalmology  Class 2 obesity due to excess calories without serious comorbidity with body mass index (BMI) of 36.0 to 36.9 in adult -     phentermine (ADIPEX-P) 37.5 MG tablet; Take 1 tablet (37.5 mg total) by mouth daily before breakfast.  at this point I do think patient has some blepharitis which is complicating 2 eye masses. She has had the eye masses for a while. On looks like a wart the other looks more like a skin tag.  She would like removed. I will refer to ophthalmology for removal. Discussed eyelid looks inflamed today. Start with warm compresses and gentle massage. If becoming more tender and inflamed could start erythromycin ointment.   Refilled phentermine. Lost 2lbs. Follow up in 2 months. Continue with diet and exercise.   Skin Tag Removal Procedure Note  Pre-operative Diagnosis: Classic skin tags (acrochordon)  Post-operative Diagnosis: Classic skin tags (acrochordon)  Locations:bilateral axilla and neck  Indications: irritated   Procedure Details  The risks (including bleeding and infection) and benefits of the procedure and Verbal informed consent obtained. Using sterile iris scissors, multiple skin tags were snipped off at their bases after cleansing with Betadine.  Bleeding was controlled by pressure.   Findings: Pathognomonic benign lesions  not sent for pathological exam.  Condition: Stable  Complications: none.  Plan: 1. Instructed to keep the wounds dry and covered for 24-48h and clean thereafter. 2. Warning signs of infection were reviewed.   3.  Recommended that the patient use OTC acetaminophen as needed for pain.  4. Return as needed.

## 2018-01-13 NOTE — Patient Instructions (Signed)
Warm compresses 5-10 minutes twice a day. Gentle eyelid massage.  Use erythromycin once daily for 4 weeks if not improving with above measures.  Will make referral to have removed.   Blepharitis Blepharitis is inflammation of the eyelids. Blepharitis may happen with:  Reddish, scaly skin around the scalp and eyebrows.  Burning or itching of the eyelids.  Eye discharge at night that causes the eyelashes to stick together in the morning.  Eyelashes that fall out.  Sensitivity to light.  Follow these instructions at home: Pay attention to any changes in how you look or feel. Follow these instructions to help with your condition: Keeping Clean  Wash your hands often.  Wash your eyelids with warm water or with warm water that is mixed with a small amount of baby shampoo. Do this two times per day or as often as needed.  Wash your face and eyebrows at least once a day.  Use a clean towel each time you dry your eyelids. Do not use this towel to clean or dry other areas of your body. Do not share your towel with anyone. General instructions  Avoid wearing makeup until you get better. Do not share makeup with anyone.  Avoid rubbing your eyes.  Apply warm compresses to your eyes 2 times per day for 10 minutes at a time, or as told by your health care provider.  If you were prescribed an antibiotic ointment or steroid drops, apply or use the medicine as told by your health care provider. Do not stop using the medicine even if you feel better.  Keep all follow-up visits as told by your health care provider. This is important. Contact a health care provider if:  Your eyelids feel hot.  You have blisters or a rash on your eyelids.  The condition does not go away in 2-4 days.  The inflammation gets worse. Get help right away if:  You have pain or redness that gets worse or spreads to other parts of your face.  Your vision changes.  You have pain when looking at lights or moving  objects.  You have a fever. This information is not intended to replace advice given to you by your health care provider. Make sure you discuss any questions you have with your health care provider. Document Released: 03/26/2000 Document Revised: 09/04/2015 Document Reviewed: 07/22/2014 Elsevier Interactive Patient Education  [employer].

## 2018-01-16 DIAGNOSIS — D23111 Other benign neoplasm of skin of right upper eyelid, including canthus: Secondary | ICD-10-CM | POA: Diagnosis not present

## 2018-01-16 DIAGNOSIS — H04123 Dry eye syndrome of bilateral lacrimal glands: Secondary | ICD-10-CM | POA: Diagnosis not present

## 2018-01-27 ENCOUNTER — Other Ambulatory Visit: Payer: Self-pay | Admitting: Family Medicine

## 2018-01-27 MED ORDER — DICLOFENAC SODIUM 1 % TD GEL: 4.0000 g | Freq: Four times a day (QID) | TRANSDERMAL | 0 refills | Status: DC

## 2018-01-28 ENCOUNTER — Encounter: Payer: Self-pay | Admitting: Emergency Medicine

## 2018-01-28 ENCOUNTER — Emergency Department
Admission: EM | Admit: 2018-01-28 | Discharge: 2018-01-28 | Disposition: A | Discharge status: Home / Self Care | Payer: Federal, State, Local not specified - PPO | Source: Home / Self Care | Attending: Family Medicine | Admitting: Family Medicine

## 2018-01-28 DIAGNOSIS — J069 Acute upper respiratory infection, unspecified: Secondary | ICD-10-CM

## 2018-01-28 DIAGNOSIS — H6691 Otitis media, unspecified, right ear: Secondary | ICD-10-CM | POA: Diagnosis not present

## 2018-01-28 MED ORDER — IPRATROPIUM BROMIDE 0.06 % NA SOLN: 2.0000 | Freq: Four times a day (QID) | NASAL | 1 refills | Status: DC

## 2018-01-28 MED ORDER — AZITHROMYCIN 250 MG PO TABS: 250.0000 mg | ORAL_TABLET | Freq: Every day | ORAL | 0 refills | Status: DC

## 2018-01-28 MED ORDER — BENZONATATE 100 MG PO CAPS: 100.0000 mg | ORAL_CAPSULE | Freq: Three times a day (TID) | ORAL | 0 refills | Status: DC

## 2018-01-28 NOTE — ED Triage Notes (Signed)
Patient c/o head congestion x 10 days, right ear pain, productive cough, no runny nose.

## 2018-01-28 NOTE — Discharge Instructions (Signed)
°  Please take antibiotics as prescribed and be sure to complete entire course even if you start to feel better to ensure infection does not come back.  For the Ipratropium nasal spray, be sure to use as prescribed to help prevent post-nasal drip, which can trigger coughing, especially at night.   Please follow up with family medicine in 1 week if not improving.

## 2018-01-28 NOTE — ED Provider Notes (Signed)
[name]    CSN: [id number] Arrival date & time: 01/28/18  1106     History   Chief Complaint Chief Complaint  Patient presents with  . URI    HPI Madline Oesterling is a 47 y.o. female.   HPI  Elsie Baynes is a 47 y.o. female presenting to UC with c/o 10 days worsening head congestion with facial pain and pressure, Right ear pain, and mildly productive cough. Minimal relief with OTC medications. Hx of ear and sinus infections in the past.  She has done better with azithromycin in the past than Augmentin.  Denies fever, chills, n/v/d.  Her husband was dx with bronchitis last week.    Past Medical History:  Diagnosis Date  . Allergy   . Obesity     Patient Active Problem List   Diagnosis Date Noted  . Mass of right eye 01/13/2018  . Blepharitis of right upper eyelid 01/13/2018  . Accessory skin tags 01/13/2018  . Class 2 obesity due to excess calories without serious comorbidity with body mass index (BMI) of 36.0 to 36.9 in adult 08/19/2017  . Pronation deformity of both feet 04/10/2015  . Morton's neuroma of left foot 03/10/2015  . Metatarsal deformity 03/10/2015  . Plantar fasciitis of left foot 03/10/2015  . Allergic rhinitis 12/17/2010  . Premenstrual syndrome 12/17/2010  . Tonsil stone 12/17/2010    Past Surgical History:  Procedure Laterality Date  . TONSILLECTOMY    . WISDOM TOOTH EXTRACTION      OB History    Gravida  2   Para  2   Term  2   Preterm      AB      Living  2     SAB      TAB      Ectopic      Multiple      Live Births               Home Medications    Prior to Admission medications   Medication Sig Start Date End Date Taking? Authorizing Provider  azithromycin (ZITHROMAX) 250 MG tablet Take 1 tablet (250 mg total) by mouth daily. Take first 2 tablets together, then 1 every day until finished. 01/28/18   Lurene Shadow, PA-C  benzonatate (TESSALON) 100 MG capsule Take 1-2 capsules (100-200 mg total) by  mouth every 8 (eight) hours. 01/28/18   Lurene Shadow, PA-C  diclofenac sodium (VOLTAREN) 1 % GEL Apply 4 g topically 4 (four) times daily. APPOINTMENT NEEDED FOR FURTHER REFILLS 01/27/18   Tandy Gaw L, PA-C  fexofenadine (ALLEGRA) 180 MG tablet Take 180 mg by mouth daily.    [provider]  ipratropium (ATROVENT) 0.06 % nasal spray Place 2 sprays into both nostrils 4 (four) times daily. 01/28/18   Lurene Shadow, PA-C  Multiple Vitamin (MULTIVITAMIN) tablet Take 1 tablet by mouth daily.    [provider]  phentermine (ADIPEX-P) 37.5 MG tablet Take 1 tablet (37.5 mg total) by mouth daily before breakfast. 01/13/18   Breeback, Jade L, PA-C  vitamin C (ASCORBIC ACID) 250 MG tablet Take 250 mg by mouth daily.    [provider]    Family History Family History  Problem Relation Age of Onset  . Hypertension Father   . Heart attack Maternal Grandfather   . Heart attack Maternal Uncle   . Diabetes Maternal Aunt     Social History Social History   Tobacco Use  . Smoking  status: Former Smoker    Last attempt to quit: 09/01/1995    Years since quitting: 22.4  . Smokeless tobacco: Never Used  Substance Use Topics  . Alcohol use: No    Alcohol/week: 0.0 standard drinks  . Drug use: No     Allergies   Patient has no known allergies.   Review of Systems Review of Systems  Constitutional: Negative for chills and fever.  HENT: Positive for congestion, ear pain, postnasal drip, sinus pressure, sinus pain and sore throat. Negative for rhinorrhea.   Respiratory: Positive for cough. Negative for shortness of breath and wheezing.   Gastrointestinal: Negative for diarrhea, nausea and vomiting.  Neurological: Positive for headaches. Negative for dizziness and light-headedness.     Physical Exam Triage Vital Signs ED Triage Vitals  Enc Vitals Group     BP 01/28/18 1125 131/81     Pulse Rate 01/28/18 1125 95     Resp --      Temp 01/28/18 1125 98.2 F  (36.8 C)     Temp Source 01/28/18 1125 Oral     SpO2 01/28/18 1125 97 %     Weight 01/28/18 1126 219 lb 8 oz (99.6 kg)     Height 01/28/18 1126 5\' 7"  (1.702 m)     Head Circumference --      Peak Flow --      Pain Score 01/28/18 1126 0     Pain Loc --      Pain Edu? --      Excl. in GC? --    No data found.  Updated Vital Signs BP 131/81 (BP Location: Right Arm)   Pulse 95   Temp 98.2 F (36.8 C) (Oral)   Ht 5\' 7"  (1.702 m)   Wt 219 lb 8 oz (99.6 kg)   SpO2 97%   BMI 34.38 kg/m   Visual Acuity Right Eye Distance:   Left Eye Distance:   Bilateral Distance:    Right Eye Near:   Left Eye Near:    Bilateral Near:     Physical Exam  Constitutional: She is oriented to person, place, and time. She appears well-developed and well-nourished.  HENT:  Head: Normocephalic and atraumatic.  Right Ear: Tympanic membrane is erythematous and bulging.  Left Ear: Tympanic membrane normal.  Nose: Mucosal edema present. Right sinus exhibits maxillary sinus tenderness and frontal sinus tenderness. Left sinus exhibits maxillary sinus tenderness and frontal sinus tenderness.  Mouth/Throat: Uvula is midline, oropharynx is clear and moist and mucous membranes are normal.  Eyes: EOM are normal.  Neck: Normal range of motion. Neck supple.  Cardiovascular: Normal rate and regular rhythm.  Pulmonary/Chest: Effort normal and breath sounds normal. No stridor. No respiratory distress. She has no wheezes. She has no rales.  Musculoskeletal: Normal range of motion.  Lymphadenopathy:    She has no cervical adenopathy.  Neurological: She is alert and oriented to person, place, and time.  Skin: Skin is warm and dry.  Psychiatric: She has a normal mood and affect. Her behavior is normal.  Nursing note and vitals reviewed.    UC Treatments / Results  Labs (all labs ordered are listed, but only abnormal results are displayed) Labs Reviewed - No data to display  EKG None  Radiology No results  found.  Procedures Procedures (including critical care time)  Medications Ordered in UC Medications - No data to display  Initial Impression / Assessment and Plan / UC Course  I have reviewed the triage vital  signs and the nursing notes.  Pertinent labs & imaging results that were available during my care of the patient were reviewed by me and considered in my medical decision making (see chart for details).     Will tx for Right AOM secondary to URI Will start on Azithromycin, last time she was on Augmentin, she had to f/u with PCP for tx with a Zpak  Final Clinical Impressions(s) / UC Diagnoses   Final diagnoses:  Right acute otitis media  Upper respiratory tract infection, unspecified type     Discharge Instructions      Please take antibiotics as prescribed and be sure to complete entire course even if you start to feel better to ensure infection does not come back.  For the Ipratropium nasal spray, be sure to use as prescribed to help prevent post-nasal drip, which can trigger coughing, especially at night.   Please follow up with family medicine in 1 week if not improving.     ED Prescriptions    Medication Sig Dispense Auth. Provider   azithromycin (ZITHROMAX) 250 MG tablet Take 1 tablet (250 mg total) by mouth daily. Take first 2 tablets together, then 1 every day until finished. 6 tablet Waylan Rocher O, PA-C   ipratropium (ATROVENT) 0.06 % nasal spray Place 2 sprays into both nostrils 4 (four) times daily. 15 mL Vyolet Sakuma O, PA-C   benzonatate (TESSALON) 100 MG capsule Take 1-2 capsules (100-200 mg total) by mouth every 8 (eight) hours. 21 capsule Lurene Shadow, PA-C     Controlled Substance Prescriptions [place] Controlled Substance Registry consulted? Not Applicable   Lurene Shadow, PA-C 01/28/18 1204

## 2018-03-27 ENCOUNTER — Other Ambulatory Visit: Payer: Self-pay | Admitting: Physician Assistant

## 2018-03-27 DIAGNOSIS — Z6836 Body mass index (BMI) 36.0-36.9, adult: Secondary | ICD-10-CM

## 2018-03-27 DIAGNOSIS — E6609 Other obesity due to excess calories: Secondary | ICD-10-CM

## 2018-04-08 ENCOUNTER — Other Ambulatory Visit: Payer: Self-pay | Admitting: Physician Assistant

## 2018-04-08 DIAGNOSIS — E6609 Other obesity due to excess calories: Secondary | ICD-10-CM

## 2018-04-08 DIAGNOSIS — Z6836 Body mass index (BMI) 36.0-36.9, adult: Secondary | ICD-10-CM

## 2018-05-05 ENCOUNTER — Encounter: Payer: Self-pay | Admitting: Physician Assistant

## 2018-05-05 ENCOUNTER — Ambulatory Visit (INDEPENDENT_AMBULATORY_CARE_PROVIDER_SITE_OTHER): Payer: Federal, State, Local not specified - PPO | Admitting: Physician Assistant

## 2018-05-05 VITALS — BP 131/83 | HR 83 | Ht 67.0 in | Wt 223.0 lb

## 2018-05-05 DIAGNOSIS — E6609 Other obesity due to excess calories: Secondary | ICD-10-CM

## 2018-05-05 DIAGNOSIS — Z6836 Body mass index (BMI) 36.0-36.9, adult: Secondary | ICD-10-CM | POA: Diagnosis not present

## 2018-05-05 DIAGNOSIS — N951 Menopausal and female climacteric states: Secondary | ICD-10-CM | POA: Diagnosis not present

## 2018-05-05 MED ORDER — PHENTERMINE HCL 37.5 MG PO TABS: 37.5000 mg | ORAL_TABLET | Freq: Every day | ORAL | 0 refills | Status: DC

## 2018-05-05 NOTE — Progress Notes (Signed)
Subjective:    Patient ID: Rachel [name], female    DOB: 1970/12/09, 48 y.o.   MRN: [id number]  HPI  Patient is a 48 year old female who presents to the clinic to discuss obesity and weight loss medication.  She has been on phentermine since 09/2017.  Her starting weight was 238 and she is down to 223.  She went all the way down to 220 but gained 3 pounds since coming off of phentermine in the last month.  She is only taking one half the tablet.  She seems to tolerate phentermine very well.  She is wondering if she could go back on it.  She is walking but she does not have any specific diet plans.  She does mention her menopausal symptoms.  She feels like her focus has decreased, hot flashes, irregular..  Maca over-the-counter supplement has been helping a lot with the symptoms.  She is wondering if there is anything else we can do. .  .. Active Ambulatory Problems    Diagnosis Date Noted  . Morton's neuroma of left foot 03/10/2015  . Metatarsal deformity 03/10/2015  . Plantar fasciitis of left foot 03/10/2015  . Pronation deformity of both feet 04/10/2015  . Class 2 obesity due to excess calories without serious comorbidity with body mass index (BMI) of 36.0 to 36.9 in adult 08/19/2017  . Allergic rhinitis 12/17/2010  . Premenstrual syndrome 12/17/2010  . Tonsil stone 12/17/2010  . Mass of right eye 01/13/2018  . Blepharitis of right upper eyelid 01/13/2018  . Accessory skin tags 01/13/2018  . Menopausal symptoms 05/05/2018   Resolved Ambulatory Problems    Diagnosis Date Noted  . No Resolved Ambulatory Problems   Past Medical History:  Diagnosis Date  . Allergy   . Obesity      Review of Systems  All other systems reviewed and are negative.      Objective:   Physical Exam Vitals signs reviewed.  Constitutional:      Appearance: Normal appearance.  HENT:     Head: Normocephalic and atraumatic.  Cardiovascular:     Rate and Rhythm: Normal rate and regular rhythm.   Pulmonary:     Effort: Pulmonary effort is normal.     Breath sounds: Normal breath sounds.  Neurological:     General: No focal deficit present.     Mental Status: She is alert and oriented to person, place, and time.  Psychiatric:        Mood and Affect: Mood normal.        Behavior: Behavior normal.           Assessment & Plan:  .Rachel [name] was seen today for medication refill.  Diagnoses and all orders for this visit:  Class 2 obesity due to excess calories without serious comorbidity with body mass index (BMI) of 36.0 to 36.9 in adult -     phentermine (ADIPEX-P) 37.5 MG tablet; Take 1 tablet (37.5 mg total) by mouth daily before breakfast.  Menopausal symptoms   Discussed weight in detail.  I did discuss that continuing on phentermine for long-term is not really an option.  As she can tell weight gain does occur once stopping medication.  She has still lost 20 pounds and that is wonderful.  I would like to see her continue to lose under 200 pounds.  We discussed long-term weight loss medications.  I strongly encouraged her to look into Saxenda and check with her insurance to see what the coverage is.  I  would like to transition her from phentermine to a long term weight loss medication.  Continue to increase exercise and watch portion sizes.  So blood supplementation is helping with some very menopausal symptoms.  Continue on this supplement.  Discussed her inability to focus.  Certainly this can happen with the changes in hormones.  We could consider Wellbutrin to see if it would help.  Encouraged her to increase exercise as that can help with focus.  If she is finding that this is very problematic we can get her tested for ADHD and see if she would benefit from a stimulant.  She denies any depression.  Marland Kitchen..Spent 30 minutes with patient and greater than 50 percent of visit spent counseling patient regarding treatment plan.

## 2018-05-05 NOTE — Patient Instructions (Addendum)
saxenda Qsymia(topamax/phentermine) Contrave(wellbutrin/naltrextone)

## 2018-05-11 DIAGNOSIS — K080 Exfoliation of teeth due to systemic causes: Secondary | ICD-10-CM | POA: Diagnosis not present

## 2018-07-04 ENCOUNTER — Other Ambulatory Visit: Payer: Self-pay

## 2018-07-04 ENCOUNTER — Encounter: Payer: Self-pay | Admitting: Physician Assistant

## 2018-07-04 ENCOUNTER — Telehealth (INDEPENDENT_AMBULATORY_CARE_PROVIDER_SITE_OTHER): Payer: Federal, State, Local not specified - PPO | Admitting: Physician Assistant

## 2018-07-04 VITALS — HR 88 | Wt 220.0 lb

## 2018-07-04 DIAGNOSIS — N943 Premenstrual tension syndrome: Secondary | ICD-10-CM

## 2018-07-04 DIAGNOSIS — Z6836 Body mass index (BMI) 36.0-36.9, adult: Secondary | ICD-10-CM

## 2018-07-04 DIAGNOSIS — N951 Menopausal and female climacteric states: Secondary | ICD-10-CM

## 2018-07-04 DIAGNOSIS — E6609 Other obesity due to excess calories: Secondary | ICD-10-CM | POA: Diagnosis not present

## 2018-07-04 MED ORDER — PHENTERMINE HCL 15 MG PO CAPS: 15.0000 mg | ORAL_CAPSULE | ORAL | 0 refills | Status: DC

## 2018-07-04 NOTE — Progress Notes (Signed)
..  Virtual Visit via Telephone Note  I connected with Rachel [name] on 07/04/18 at  8:30 AM EDT by telephone and verified that I am speaking with the correct person using two identifiers.   I discussed the limitations, risks, security and privacy concerns of performing an evaluation and management service by telephone and the availability of in person appointments. I also discussed with the patient that there may be a patient responsible charge related to this service. The patient expressed understanding and agreed to proceed.   History of Present Illness: Patient is a 48 year old female who calls in to discuss obesity and follow-up on weight loss, premenstrual syndrome, menopausal symptoms.  Patient was last seen on 05/05/2018.  At that time she had not lost any significant weight other than the drop from her original weight at 238.  She has been on phentermine only the whole time.  We discussed at last visit switching to a longer term medication that could get her to her goal of under 200.  She admits she has not called insurance to see if they would approve Saxenda.  She does feel like her focus and stress level is better over the last 2 months.  For her exercise she is doing some minimal walking.  She admits her diet is not where it needs to be.  Overall her mood is doing well.  She does feel like her supplements are helping with her menopausal symptoms.  She had 1 menstrual cycle in the last 3 months and noticed a lot more food cravings during the menstrual cycle.   Observations/Objective: No acute distress.   .. Vitals:   07/04/18 0835  Weight: 220 lb (99.8 kg)   Pt is down another 4lbs.   Pt does not have access to BP cuff.   Assessment and Plan: Marland KitchenMarland KitchenDiagnoses and all orders for this visit:  Menopausal symptoms  Class 2 obesity due to excess calories without serious comorbidity with body mass index (BMI) of 36.0 to 36.9 in adult -     phentermine 15 MG capsule; Take 1 capsule (15 mg  total) by mouth every morning.  Premenstrual syndrome   Discussed in detail about HIT exercises and diet changes such as increasing protein limiting carbs. Discussed other weight loss medicaitons and side effects such as saxenda, wellbutrin, topamax.   Pt is going to call insurance and figure out a plan on what she want to add to phentermine 15mg .   Reports mood to be doing well.  Follow Up Instructions:    I discussed the assessment and treatment plan with the patient. The patient was provided an opportunity to ask questions and all were answered. The patient agreed with the plan and demonstrated an understanding of the instructions.   The patient was advised to call back or seek an in-person evaluation if the symptoms worsen or if the condition fails to improve as anticipated.  I provided 22 minutes of non-face-to-face time during this encounter.   Tandy Gaw, PA-C

## 2018-08-02 ENCOUNTER — Ambulatory Visit (INDEPENDENT_AMBULATORY_CARE_PROVIDER_SITE_OTHER): Payer: Federal, State, Local not specified - PPO | Admitting: Psychology

## 2018-08-02 DIAGNOSIS — F339 Major depressive disorder, recurrent, unspecified: Secondary | ICD-10-CM | POA: Diagnosis not present

## 2018-08-08 ENCOUNTER — Ambulatory Visit (INDEPENDENT_AMBULATORY_CARE_PROVIDER_SITE_OTHER): Payer: Federal, State, Local not specified - PPO | Admitting: Psychology

## 2018-08-08 DIAGNOSIS — F339 Major depressive disorder, recurrent, unspecified: Secondary | ICD-10-CM

## 2018-08-16 ENCOUNTER — Ambulatory Visit (INDEPENDENT_AMBULATORY_CARE_PROVIDER_SITE_OTHER): Payer: Federal, State, Local not specified - PPO | Admitting: Psychology

## 2018-08-16 DIAGNOSIS — F339 Major depressive disorder, recurrent, unspecified: Secondary | ICD-10-CM | POA: Diagnosis not present

## 2018-08-29 ENCOUNTER — Ambulatory Visit (INDEPENDENT_AMBULATORY_CARE_PROVIDER_SITE_OTHER): Payer: Federal, State, Local not specified - PPO | Admitting: Psychology

## 2018-08-29 DIAGNOSIS — F339 Major depressive disorder, recurrent, unspecified: Secondary | ICD-10-CM | POA: Diagnosis not present

## 2018-09-12 ENCOUNTER — Ambulatory Visit (INDEPENDENT_AMBULATORY_CARE_PROVIDER_SITE_OTHER): Payer: Federal, State, Local not specified - PPO | Admitting: Psychology

## 2018-09-12 DIAGNOSIS — F339 Major depressive disorder, recurrent, unspecified: Secondary | ICD-10-CM | POA: Diagnosis not present

## 2018-09-20 ENCOUNTER — Ambulatory Visit: Payer: Federal, State, Local not specified - PPO | Admitting: Family Medicine

## 2018-09-21 ENCOUNTER — Other Ambulatory Visit: Payer: Self-pay | Admitting: Family Medicine

## 2018-09-21 ENCOUNTER — Ambulatory Visit: Payer: Federal, State, Local not specified - PPO | Admitting: Family Medicine

## 2018-09-21 DIAGNOSIS — M722 Plantar fascial fibromatosis: Secondary | ICD-10-CM | POA: Diagnosis not present

## 2018-09-21 DIAGNOSIS — R05 Cough: Secondary | ICD-10-CM | POA: Diagnosis not present

## 2018-09-21 DIAGNOSIS — R059 Cough, unspecified: Secondary | ICD-10-CM

## 2018-09-21 MED ORDER — ALBUTEROL SULFATE HFA 108 (90 BASE) MCG/ACT IN AERS
2.0000 | INHALATION_SPRAY | Freq: Four times a day (QID) | RESPIRATORY_TRACT | 0 refills | Status: DC | PRN
Start: 2018-09-21 — End: 2018-10-16

## 2018-09-21 MED ORDER — OMEPRAZOLE 40 MG PO CPDR: 40.0000 mg | DELAYED_RELEASE_CAPSULE | Freq: Every day | ORAL | 3 refills | Status: DC

## 2018-09-21 MED ORDER — NITROGLYCERIN 0.2 MG/HR TD PT24: MEDICATED_PATCH | TRANSDERMAL | 1 refills | Status: DC

## 2018-09-21 MED ORDER — IPRATROPIUM BROMIDE 0.06 % NA SOLN: 2.0000 | NASAL | 6 refills | Status: DC | PRN

## 2018-09-21 NOTE — Progress Notes (Signed)
Rachel [name] is a 48 y.o. female [name] today for plantar fasciitis and cough.  Rachel [name] has a history of intermittent plantar fasciitis bilaterally ongoing for several years now.  Typically her symptoms are reasonably well controlled.  However started about 3 weeks ago she did some yard work and some old tennis shoes.  She developed bilateral heel pain after that.  She notes her right side is worse than her left.  She is tried using over-the-counter inserts ice massage stretching band exercises, ball rolling and compression stockings.  This is helped only a little.  She notes her pain can be quite severe at times especially when she gets out of bed the morning.  She notes her peak pain was about 2 days ago and it slightly improved today compared to 2 days ago.  Additionally she notes that over the past several weeks she has had postnasal drainage and a mild dry cough.  She attributes this to allergies.  She takes Allegra and Flonase nasal spray.  Additionally at night sometimes she has some acid reflux symptoms especially when she lays down.  She has used her husband's over-the-counter Nexium which helps a little.  She also notes occasional wheezing.  She wonders if she may have asthma.  She denies shortness of breath fevers chills nausea vomiting or diarrhea.    ROS as above:  Exam:  Hr 80  rr 12 Wt Readings from Last 5 Encounters:  07/04/18 220 lb (99.8 kg)  05/05/18 223 lb (101.2 kg)  01/28/18 219 lb 8 oz (99.6 kg)  01/13/18 220 lb (99.8 kg)  12/13/17 222 lb (100.7 kg)    Gen: Well NAD HEENT: EOMI,  MMM posterior pharynx with minimal cobblestoning.  Normal nasal turbinates bilaterally.  No cervical lymphadenopathy. Lungs: Normal work of breathing. CTABL no wheezing present Heart: RRR no MRG Abd: NABS, Soft. Nondistended, Nontender Exts: Brisk  capillary refill, warm and well perfused.  Feet bilaterally are normal-appearing with no deformities.  Tender palpation right plantar medial calcaneus.  Also tender at the left plantar calcaneus but not as bad. Normal gait. Normal strength.     Assessment and Plan: 48 y.o. female with bilateral right worse than left plantar fasciitis.  Patient has had some conservative management options but not complete trial of conservative management in my opinion.  Plan to maximize home exercise program with eccentric heel exercises using body weight.  Additionally will use high-quality over-the-counter insoles.  Also use ice massage in the evening.  Additionally he has nitroglycerin patch protocol.  This may help her symptoms as well.  Discussed headache possibilities with this medication.  Recheck in a few weeks 3 to 4 weeks if not improving.  Additionally patient has a dry cough especially the evening.  She has multiple potential cofactors including postnasal drainage due to allergies, likely mild acid reflux symptoms, and possibly cough variant asthma.  Plan to treat with continued Allegra and Flonase nasal spray.  Will add Atrovent nasal spray in the evening as well.  This should help postnasal drainage symptoms.  Additionally will start omeprazole for acid reflux.  Will also use a trial of albuterol for possible cough variant asthma.  Recheck if not improving.    PDMP not reviewed this encounter. No orders of the defined types were placed in this encounter.  Meds ordered this encounter  Medications  . nitroGLYCERIN (NITRODUR - DOSED IN MG/24 HR) 0.2 mg/hr patch  Sig: Apply 1/4 patch daily to tendon for tendonitis.    Dispense:  30 patch    Refill:  1  . ipratropium (ATROVENT) 0.06 % nasal spray    Sig: Place 2 sprays into both nostrils every 4 (four) hours as needed.    Dispense:  10 mL    Refill:  6  . omeprazole (PRILOSEC) 40 MG capsule    Sig: Take 1 capsule (40 mg total) by mouth daily.     Dispense:  30 capsule    Refill:  3  . albuterol (VENTOLIN HFA) 108 (90 Base) MCG/ACT inhaler    Sig: Inhale 2 puffs into the lungs every 6 (six) hours as needed for wheezing or shortness of breath.    Dispense:  1 Inhaler    Refill:  0     Historical information moved to improve visibility of documentation.  Past Medical History:  Diagnosis Date  . Allergy   . Obesity    Past Surgical History:  Procedure Laterality Date  . TONSILLECTOMY    . WISDOM TOOTH EXTRACTION     Social History   Tobacco Use  . Smoking status: Former Smoker    Quit date: 09/01/1995    Years since quitting: 23.0  . Smokeless tobacco: Never Used  Substance Use Topics  . Alcohol use: No    Alcohol/week: 0.0 standard drinks   family history includes Diabetes in her maternal aunt; Heart attack in her maternal grandfather and maternal uncle; Hypertension in her father.  Medications: Current Outpatient Medications  Medication Sig Dispense Refill  . fluticasone (FLONASE) 50 MCG/ACT nasal spray Place into both nostrils daily.    Marland Kitchen albuterol (VENTOLIN HFA) 108 (90 Base) MCG/ACT inhaler Inhale 2 puffs into the lungs every 6 (six) hours as needed for wheezing or shortness of breath. 1 Inhaler 0  . diclofenac sodium (VOLTAREN) 1 % GEL Apply 4 g topically 4 (four) times daily. APPOINTMENT NEEDED FOR FURTHER REFILLS 100 g 0  . fexofenadine (ALLEGRA) 180 MG tablet Take 180 mg by mouth daily.    Marland Kitchen ipratropium (ATROVENT) 0.06 % nasal spray Place 2 sprays into both nostrils every 4 (four) hours as needed. 10 mL 6  . MACA ROOT PO Take by mouth.    . Multiple Vitamin (MULTIVITAMIN) tablet Take 1 tablet by mouth daily.    . nitroGLYCERIN (NITRODUR - DOSED IN MG/24 HR) 0.2 mg/hr patch Apply 1/4 patch daily to tendon for tendonitis. 30 patch 1  . omeprazole (PRILOSEC) 40 MG capsule Take 1 capsule (40 mg total) by mouth daily. 30 capsule 3  . phentermine 15 MG capsule Take 1 capsule (15 mg total) by mouth every morning.  90 capsule 0  . vitamin C (ASCORBIC ACID) 250 MG tablet Take 250 mg by mouth daily.     No current facility-administered medications for this visit.    No Known Allergies   Discussed warning signs or symptoms. Please see discharge instructions. Patient expresses understanding.

## 2018-09-21 NOTE — Patient Instructions (Addendum)
Thank you for coming in today.  Do the heel exercises. Go from up to down slowly. It should take about 3-4 seconds.  Do 30 reps 2-3 x daily.  Use heel cushion.  You can get slip in silicone heel cushion.  Silicone Gel Heel Protector Sleeve  You can also get good quality over the counter sport insole  Use a night splint.   Do the ice massage. Pull the big toe back.   Ice cube: freeze cup of water and use as an ice massage.   Use the atrovent nasal spray at night  Use albuterol as needed.  COntinue flonase and allergy medicine.  Take omeprazole for acid reflux for 1 month.  Let me know how you do with the cough.   Nitroglycerin Protocol   Apply 1/4 nitroglycerin patch to affected area daily.  Change position of patch within the affected area every 24 hours.  You may experience a headache during the first 1-2 weeks of using the patch, these should subside.  If you experience headaches after beginning nitroglycerin patch treatment, you may take your preferred over the counter pain reliever.  Another side effect of the nitroglycerin patch is skin irritation or rash related to patch adhesive.  Please notify our office if you develop more severe headaches or rash, and stop the patch.  Tendon healing with nitroglycerin patch may require 12 to 24 weeks depending on the extent of injury.  Men should not use if taking Viagra, Cialis, or Levitra.   Do not use if you have migraines or rosacea.    Plantar Fasciitis  Plantar fasciitis is a painful foot condition that affects the heel. It occurs when the band of tissue that connects the toes to the heel bone (plantar fascia) becomes irritated. This can happen as the result of exercising too much or doing other repetitive activities (overuse injury). The pain from plantar fasciitis can range from mild irritation to severe pain that makes it difficult to walk or move. The pain is usually worse in the morning after sleeping, or after  sitting or lying down for a while. Pain may also be worse after long periods of walking or standing. What are the causes? This condition may be caused by:  Standing for long periods of time.  Wearing shoes that do not have good arch support.  Doing activities that put stress on joints (high-impact activities), including running, aerobics, and ballet.  Being overweight.  An abnormal way of walking (gait).  Tight muscles in the back of your lower leg (calf).  High arches in your feet.  Starting a new athletic activity. What are the signs or symptoms? The main symptom of this condition is heel pain. Pain may:  Be worse with first steps after a time of rest, especially in the morning after sleeping or after you have been sitting or lying down for a while.  Be worse after long periods of standing still.  Decrease after 30-45 minutes of activity, such as gentle walking. How is this diagnosed? This condition may be diagnosed based on your medical history and your symptoms. Your health care provider may ask questions about your activity level. Your health care provider will do a physical exam to check for:  A tender area on the bottom of your foot.  A high arch in your foot.  Pain when you move your foot.  Difficulty moving your foot. You may have imaging tests to confirm the diagnosis, such as:  X-rays.  Ultrasound.  MRI. How is this treated? Treatment for plantar fasciitis depends on how severe your condition is. Treatment may include:  Rest, ice, applying pressure (compression), and raising the affected foot (elevation). This may be called RICE therapy. Your health care provider may recommend RICE therapy along with over-the-counter pain medicines to manage your pain.  Exercises to stretch your calves and your plantar fascia.  A splint that holds your foot in a stretched, upward position while you sleep (night splint).  Physical therapy to relieve symptoms and prevent  problems in the future.  Injections of steroid medicine (cortisone) to relieve pain and inflammation.  Stimulating your plantar fascia with electrical impulses (extracorporeal shock wave therapy). This is usually the last treatment option before surgery.  Surgery, if other treatments have not worked after 12 months. Follow these instructions at home:  Managing pain, stiffness, and swelling  If directed, put ice on the painful area: ? Put ice in a plastic bag, or use a frozen bottle of water. ? Place a towel between your skin and the bag or bottle. ? Roll the bottom of your foot over the bag or bottle. ? Do this for 20 minutes, 2-3 times a day.  Wear athletic shoes that have air-sole or gel-sole cushions, or try wearing soft shoe inserts that are designed for plantar fasciitis.  Raise (elevate) your foot above the level of your heart while you are sitting or lying down. Activity  Avoid activities that cause pain. Ask your health care provider what activities are safe for you.  Do physical therapy exercises and stretches as told by your health care provider.  Try activities and forms of exercise that are easier on your joints (low-impact). Examples include swimming, water aerobics, and biking. General instructions  Take over-the-counter and prescription medicines only as told by your health care provider.  Wear a night splint while sleeping, if told by your health care provider. Loosen the splint if your toes tingle, become numb, or turn cold and blue.  Maintain a healthy weight, or work with your health care provider to lose weight as needed.  Keep all follow-up visits as told by your health care provider. This is important. Contact a health care provider if you:  Have symptoms that do not go away after caring for yourself at home.  Have pain that gets worse.  Have pain that affects your ability to move or do your daily activities. Summary  Plantar fasciitis is a painful  foot condition that affects the heel. It occurs when the band of tissue that connects the toes to the heel bone (plantar fascia) becomes irritated.  The main symptom of this condition is heel pain that may be worse after exercising too much or standing still for a long time.  Treatment varies, but it usually starts with rest, ice, compression, and elevation (RICE therapy) and over-the-counter medicines to manage pain. This information is not intended to replace advice given to you by your health care provider. Make sure you discuss any questions you have with your health care provider. Document Released: 12/22/2000 Document Revised: 01/24/2017 Document Reviewed: 01/24/2017 Elsevier Interactive Patient Education  2019 [employer].

## 2018-09-26 ENCOUNTER — Ambulatory Visit: Payer: Federal, State, Local not specified - PPO | Admitting: Psychology

## 2018-10-02 ENCOUNTER — Ambulatory Visit: Payer: Federal, State, Local not specified - PPO | Admitting: Psychology

## 2018-10-09 ENCOUNTER — Ambulatory Visit (INDEPENDENT_AMBULATORY_CARE_PROVIDER_SITE_OTHER): Payer: Federal, State, Local not specified - PPO | Admitting: Psychology

## 2018-10-09 DIAGNOSIS — F339 Major depressive disorder, recurrent, unspecified: Secondary | ICD-10-CM | POA: Diagnosis not present

## 2018-10-16 ENCOUNTER — Other Ambulatory Visit: Payer: Self-pay | Admitting: Neurology

## 2018-10-16 MED ORDER — ALBUTEROL SULFATE HFA 108 (90 BASE) MCG/ACT IN AERS: 2.0000 | INHALATION_SPRAY | Freq: Four times a day (QID) | RESPIRATORY_TRACT | 0 refills | Status: DC | PRN

## 2018-10-20 ENCOUNTER — Encounter: Payer: Self-pay | Admitting: Family Medicine

## 2018-10-23 ENCOUNTER — Ambulatory Visit (INDEPENDENT_AMBULATORY_CARE_PROVIDER_SITE_OTHER): Payer: Federal, State, Local not specified - PPO | Admitting: Psychology

## 2018-10-23 DIAGNOSIS — F339 Major depressive disorder, recurrent, unspecified: Secondary | ICD-10-CM

## 2018-10-26 ENCOUNTER — Telehealth: Payer: Federal, State, Local not specified - PPO | Admitting: Family

## 2018-10-26 DIAGNOSIS — Z20822 Contact with and (suspected) exposure to covid-19: Secondary | ICD-10-CM

## 2018-10-26 MED ORDER — BENZONATATE 100 MG PO CAPS: 100.0000 mg | ORAL_CAPSULE | Freq: Three times a day (TID) | ORAL | 0 refills | Status: DC | PRN

## 2018-10-26 MED ORDER — ALBUTEROL SULFATE HFA 108 (90 BASE) MCG/ACT IN AERS
2.0000 | INHALATION_SPRAY | Freq: Four times a day (QID) | RESPIRATORY_TRACT | 0 refills | Status: DC | PRN
Start: 2018-10-26 — End: 2019-09-05

## 2018-10-26 NOTE — Progress Notes (Signed)
Greater than 5 minutes, yet less than 10 minutes of time have been spent researching, coordinating, and implementing care for this patient today.  Thank you for the details you included in the comment boxes. Those details are very helpful in determining the best course of treatment for you and help us to provide the best care.  E-Visit for Corona Virus Screening   Your current symptoms could be consistent with the coronavirus.  Many health care providers can now test patients at their office but not all are.  [place] has multiple testing sites. For information on our COVID testing locations and hours go to [email or web address]  Please quarantine yourself while awaiting your test results.    COVID-19 is a respiratory illness with symptoms that are similar to the flu. Symptoms are typically mild to moderate, but there have been cases of severe illness and death due to the virus. The following symptoms may appear 2-14 days after exposure: . Fever . Cough . Shortness of breath or difficulty breathing . Chills . Repeated shaking with chills . Muscle pain . Headache . Sore throat . New loss of taste or smell . Fatigue . Congestion or runny nose . Nausea or vomiting . Diarrhea  It is vitally important that if you feel that you have an infection such as this virus or any other virus that you stay home and away from places where you may spread it to others.  You should self-quarantine for 14 days if you have symptoms that could potentially be coronavirus or have been in close contact a with a person diagnosed with COVID-19 within the last 2 weeks. You should avoid contact with people age 65 and older.   You should wear a mask or cloth face covering over your nose and mouth if you must be around other people or animals, including pets (even at home). Try to stay at least 6 feet away from other people. This will protect the people around you.  You can use medication  such as A prescription cough medication called Tessalon Perles 100 mg. You may take 1-2 capsules every 8 hours as needed for cough and A prescription inhaler called Albuterol MDI 90 mcg /actuation 2 puffs every 4 hours as needed for shortness of breath, wheezing, cough  You may also take acetaminophen (Tylenol) as needed for fever.   Reduce your risk of any infection by using the same precautions used for avoiding the common cold or flu:  . Wash your hands often with soap and warm water for at least 20 seconds.  If soap and water are not readily available, use an alcohol-based hand sanitizer with at least 60% alcohol.  . If coughing or sneezing, cover your mouth and nose by coughing or sneezing into the elbow areas of your shirt or coat, into a tissue or into your sleeve (not your hands). . Avoid shaking hands with others and consider head nods or verbal greetings only. . Avoid touching your eyes, nose, or mouth with unwashed hands.  . Avoid close contact with people who are sick. . Avoid places or events with large numbers of people in one location, like concerts or sporting events. . Carefully consider travel plans you have or are making. . If you are planning any travel outside or inside the [place], visit the CDC's Travelers' Health webpage for the latest health notices. . If you have some symptoms but not all symptoms, continue to monitor at home and seek medical attention if your symptoms worsen. .   If you are having a medical emergency, call 911.  HOME CARE . Only take medications as instructed by your medical team. . Drink plenty of fluids and get plenty of rest. . A steam or ultrasonic humidifier can help if you have congestion.   GET HELP RIGHT AWAY IF YOU HAVE EMERGENCY WARNING SIGNS** FOR COVID-19. If you or someone is showing any of these signs seek emergency medical care immediately. Call 911 or proceed to your closest emergency facility if: . You develop worsening high  fever. . Trouble breathing . Bluish lips or face . Persistent pain or pressure in the chest . New confusion . Inability to wake or stay awake . You cough up blood. . Your symptoms become more severe  **This list is not all possible symptoms. Contact your medical provider for any symptoms that are sever or concerning to you.   MAKE SURE YOU   Understand these instructions.  Will watch your condition.  Will get help right away if you are not doing well or get worse.  Your e-visit answers were reviewed by a board certified advanced clinical practitioner to complete your personal care plan.  Depending on the condition, your plan could have included both over the counter or prescription medications.  If there is a problem please reply once you have received a response from your provider.  Your safety is important to us.  If you have drug allergies check your prescription carefully.    You can use MyChart to ask questions about today's visit, request a non-urgent call back, or ask for a work or school excuse for 24 hours related to this e-Visit. If it has been greater than 24 hours you will need to follow up with your provider, or enter a new e-Visit to address those concerns. You will get an e-mail in the next two days asking about your experience.  I hope that your e-visit has been valuable and will speed your recovery. Thank you for using e-visits.    

## 2018-11-06 ENCOUNTER — Ambulatory Visit (INDEPENDENT_AMBULATORY_CARE_PROVIDER_SITE_OTHER): Payer: Federal, State, Local not specified - PPO | Admitting: Psychology

## 2018-11-06 DIAGNOSIS — F330 Major depressive disorder, recurrent, mild: Secondary | ICD-10-CM

## 2018-11-13 DIAGNOSIS — K080 Exfoliation of teeth due to systemic causes: Secondary | ICD-10-CM | POA: Diagnosis not present

## 2018-11-20 ENCOUNTER — Encounter (INDEPENDENT_AMBULATORY_CARE_PROVIDER_SITE_OTHER): Payer: Self-pay

## 2018-11-20 ENCOUNTER — Telehealth: Payer: Federal, State, Local not specified - PPO | Admitting: Family

## 2018-11-20 DIAGNOSIS — Z20822 Contact with and (suspected) exposure to covid-19: Secondary | ICD-10-CM

## 2018-11-20 DIAGNOSIS — Z20828 Contact with and (suspected) exposure to other viral communicable diseases: Secondary | ICD-10-CM

## 2018-11-20 MED ORDER — BENZONATATE 100 MG PO CAPS: 100.0000 mg | ORAL_CAPSULE | Freq: Three times a day (TID) | ORAL | 0 refills | Status: DC | PRN

## 2018-11-20 NOTE — Progress Notes (Signed)
E-Visit for Corona Virus Screening   Your current symptoms could be consistent with the coronavirus.  Many health care providers can now test patients at their office but not all are.  [place] has multiple testing sites. For information on our COVID testing locations and hours go to [email or web address]  Please quarantine yourself while awaiting your test results.  We are enrolling you in our [place] for [place] . Daily you will receive a questionnaire within the [facility] website. Our COVID 19 response team willl be monitoriing your responses daily.  You can go to one of the  testing sites listed below, while they are opened (see hours). You do not need an order. You do need to self isolate until your results return and if positive 14 days from when your symptoms started and until you are 3 days symptom free.   Testing Locations (Monday - Friday, 8 a.m. - 3:30 p.m.) . [place]: [facility], [address], [place], [place]: [place], [place], [place], [place] (entrance [address])  . [place] [address] [place], [place] (across from [facility])   COVID-19 is a respiratory illness with symptoms that are similar to the flu. Symptoms are typically mild to moderate, but there have been cases of severe illness and death due to the virus. The following symptoms may appear 2-14 days after exposure: . Fever . Cough . Shortness of breath or difficulty breathing . Chills . Repeated shaking with chills . Muscle pain . Headache . Sore throat . New loss of taste or smell . Fatigue . Congestion or runny nose . Nausea or vomiting . Diarrhea  It is vitally important that if you feel that you have an infection such as this virus or any other virus that you stay home and away from places where you may spread it to others.  You should  self-quarantine for 14 days if you have symptoms that could potentially be coronavirus or have been in close contact a with a person diagnosed with COVID-19 within the last 2 weeks. You should avoid contact with people age 62 and older.   You should wear a mask or cloth face covering over your nose and mouth if you must be around other people or animals, including pets (even at home). Try to stay at least 6 feet away from other people. This will protect the people around you.  You can use medication such as A prescription cough medication called Tessalon Perles 100 mg. You may take 1-2 capsules every 8 hours as needed for cough  You may also take acetaminophen (Tylenol) as needed for fever.   Reduce your risk of any infection by using the same precautions used for avoiding the common cold or flu:  [place] Wash your hands often with soap and warm water for at least 20 seconds.  If soap and water are not readily available, use an alcohol-based hand sanitizer with at least 60% alcohol.  . If coughing or sneezing, cover your mouth and nose by coughing or sneezing into the elbow areas of your shirt or coat, into a tissue or into your sleeve (not your hands). . Avoid shaking hands with others and consider head nods or verbal greetings only. . Avoid touching your eyes, nose, or mouth with unwashed hands.  . Avoid close contact with people who are sick. . Avoid places or events with large numbers of  people in one location, like concerts or sporting events. . Carefully consider travel plans you have or are making. . If you are planning any travel outside or inside the [place], visit the CDC's Travelers' Health webpage for the latest health notices. . If you have some symptoms but not all symptoms, continue to monitor at home and seek medical attention if your symptoms worsen. . If you are having a medical emergency, call 911.  HOME CARE . Only take medications as instructed by your medical team. . Drink plenty of  fluids and get plenty of rest. . A steam or ultrasonic humidifier can help if you have congestion.   GET HELP RIGHT AWAY IF YOU HAVE EMERGENCY WARNING SIGNS** FOR COVID-19. If you or someone is showing any of these signs seek emergency medical care immediately. Call 911 or proceed to your closest emergency facility if: . You develop worsening high fever. . Trouble breathing . Bluish lips or face . Persistent pain or pressure in the chest . New confusion . Inability to wake or stay awake . You cough up blood. . Your symptoms become more severe  **This list is not all possible symptoms. Contact your medical provider for any symptoms that are sever or concerning to you.   MAKE SURE YOU   Understand these instructions.  Will watch your condition.  Will get help right away if you are not doing well or get worse.  Your e-visit answers were reviewed by a board certified advanced clinical practitioner to complete your personal care plan.  Depending on the condition, your plan could have included both over the counter or prescription medications.  If there is a problem please reply once you have received a response from your provider.  Your safety is important to us.  If you have drug allergies check your prescription carefully.    You can use MyChart to ask questions about today's visit, request a non-urgent call back, or ask for a work or school excuse for 24 hours related to this e-Visit. If it has been greater than 24 hours you will need to follow up with your provider, or enter a new e-Visit to address those concerns. You will get an e-mail in the next two days asking about your experience.  I hope that your e-visit has been valuable and will speed your recovery. Thank you for using e-visits.

## 2018-11-21 ENCOUNTER — Encounter (INDEPENDENT_AMBULATORY_CARE_PROVIDER_SITE_OTHER): Payer: Self-pay

## 2018-11-21 ENCOUNTER — Other Ambulatory Visit: Payer: Self-pay

## 2018-11-21 DIAGNOSIS — Z20822 Contact with and (suspected) exposure to covid-19: Secondary | ICD-10-CM

## 2018-11-22 LAB — NOVEL CORONAVIRUS, NAA: SARS-CoV-2, NAA: NOT DETECTED

## 2018-11-23 ENCOUNTER — Encounter (INDEPENDENT_AMBULATORY_CARE_PROVIDER_SITE_OTHER): Payer: Self-pay

## 2018-11-24 ENCOUNTER — Encounter (INDEPENDENT_AMBULATORY_CARE_PROVIDER_SITE_OTHER): Payer: Self-pay

## 2018-11-26 ENCOUNTER — Encounter (INDEPENDENT_AMBULATORY_CARE_PROVIDER_SITE_OTHER): Payer: Self-pay

## 2018-11-27 ENCOUNTER — Ambulatory Visit (INDEPENDENT_AMBULATORY_CARE_PROVIDER_SITE_OTHER): Payer: Federal, State, Local not specified - PPO | Admitting: Psychology

## 2018-11-27 DIAGNOSIS — F339 Major depressive disorder, recurrent, unspecified: Secondary | ICD-10-CM

## 2018-12-20 DIAGNOSIS — K080 Exfoliation of teeth due to systemic causes: Secondary | ICD-10-CM | POA: Diagnosis not present

## 2018-12-21 ENCOUNTER — Ambulatory Visit (INDEPENDENT_AMBULATORY_CARE_PROVIDER_SITE_OTHER): Payer: Federal, State, Local not specified - PPO | Admitting: Psychology

## 2018-12-21 DIAGNOSIS — F331 Major depressive disorder, recurrent, moderate: Secondary | ICD-10-CM | POA: Diagnosis not present

## 2018-12-25 ENCOUNTER — Ambulatory Visit: Payer: Federal, State, Local not specified - PPO | Admitting: Psychology

## 2018-12-28 ENCOUNTER — Ambulatory Visit (INDEPENDENT_AMBULATORY_CARE_PROVIDER_SITE_OTHER): Payer: Federal, State, Local not specified - PPO | Admitting: Psychology

## 2018-12-28 DIAGNOSIS — F331 Major depressive disorder, recurrent, moderate: Secondary | ICD-10-CM | POA: Diagnosis not present

## 2019-01-11 DIAGNOSIS — K080 Exfoliation of teeth due to systemic causes: Secondary | ICD-10-CM | POA: Diagnosis not present

## 2019-02-21 ENCOUNTER — Ambulatory Visit (INDEPENDENT_AMBULATORY_CARE_PROVIDER_SITE_OTHER)
Admission: RE | Admit: 2019-02-21 | Discharge: 2019-02-21 | Disposition: A | Discharge status: Home / Self Care | Payer: Federal, State, Local not specified - PPO | Source: Ambulatory Visit

## 2019-02-21 ENCOUNTER — Other Ambulatory Visit: Payer: Self-pay

## 2019-02-21 DIAGNOSIS — H01004 Unspecified blepharitis left upper eyelid: Secondary | ICD-10-CM

## 2019-02-21 MED ORDER — ERYTHROMYCIN 5 MG/GM OP OINT: 1.0000 "application " | TOPICAL_OINTMENT | Freq: Four times a day (QID) | OPHTHALMIC | 0 refills | Status: AC

## 2019-02-21 NOTE — Discharge Instructions (Signed)
Warm compresses 3-4 times a day.  Light scrub to lash line daily.  Use of ointment can help decrease bacterial load which may help expedite healing.  If no improvement or if worsening please follow up with ophthalmology.

## 2019-02-21 NOTE — ED Provider Notes (Signed)
Virtual Visit via Video Note:  Rachel [name]  initiated request for Telemedicine visit with [facility] Urgent Care team. I connected with Rachel [name]  on 02/21/2019 at 10:29 AM  for a synchronized telemedicine visit using a video enabled HIPPA compliant telemedicine application. I verified that I am speaking with Rachel [name]  using two identifiers. Rachel Gottron, NP  was physically located in a [facility] Urgent care site and Rachel [name] was located at a different location.   The limitations of evaluation and management by telemedicine as well as the availability of in-person appointments were discussed. Patient was informed that she  may incur a bill ( including co-pay) for this virtual visit encounter. Rachel [name]  expressed understanding and gave verbal consent to proceed with virtual visit.     History of Present Illness:Rachel [name]  is a 48 y.o. female presents with complaints of stye which started a couple of weeks ago, went away with home remedies. It returned yesterday and worse today, to left upper eye lid. Had to put down a pet yesterday so was crying a lot, therefore it became more irritated. Increased pain and swelling today. No drainage from the eye. No fevers. No vision changes. Yesterday did apply warm compresses.   Past Medical History:  Diagnosis Date  . Allergy   . Obesity     No Known Allergies      Observations/Objective: Alert, oriented, non toxic in appearance. Clear coherent speech without difficulty. No increased work of breathing visualized.  Swelling and redness noted to left upper eye lid, appears to be predominately concentrated at lid and extending out; eye movements WNL and no apparent conjunctiva redness   Assessment and Plan: Appears consistent with blepharitis, warm compresses, topical eye ointment also provided. Ophthalmology follow up discussion provided. Patient verbalized understanding and agreeable to plan.     Follow Up Instructions:    I  discussed the assessment and treatment plan with the patient. The patient was provided an opportunity to ask questions and all were answered. The patient agreed with the plan and demonstrated an understanding of the instructions.   The patient was advised to call back or seek an in-person evaluation if the symptoms worsen or if the condition fails to improve as anticipated.  I provided 15 minutes of non-face-to-face time during this encounter.    Rachel Gottron, NP  02/21/2019 10:29 AM         Rachel Gottron, NP 02/21/19 1211

## 2019-03-10 ENCOUNTER — Encounter (INDEPENDENT_AMBULATORY_CARE_PROVIDER_SITE_OTHER): Payer: Self-pay

## 2019-03-10 ENCOUNTER — Telehealth: Payer: Federal, State, Local not specified - PPO | Admitting: Physician Assistant

## 2019-03-10 DIAGNOSIS — Z20822 Contact with and (suspected) exposure to covid-19: Secondary | ICD-10-CM

## 2019-03-10 NOTE — Progress Notes (Signed)
E-Visit for Corona Virus Screening   Your current symptoms could be consistent with the coronavirus.  Many health care providers can now test patients at their office but not all are.  [facility] has multiple testing sites. For information on our COVID testing locations and hours go to [email or web address]  Please quarantine yourself while awaiting your test results.  We are enrolling you in our MyChart Home [facility] for COVID19 . Daily you will receive a questionnaire within the MyChart website. Our COVID 19 response team willl be monitoriing your responses daily. Please continue good preventive care measures, including:  frequent hand-washing, avoid touching your face, cover coughs/sneezes, stay out of crowds and keep a 6 foot distance from others.    COVID-19 is a respiratory illness with symptoms that are similar to the flu. Symptoms are typically mild to moderate, but there have been cases of severe illness and death due to the virus. The following symptoms may appear 2-14 days after exposure: . Fever . Cough . Shortness of breath or difficulty breathing . Chills . Repeated shaking with chills . Muscle pain . Headache . Sore throat . New loss of taste or smell . Fatigue . Congestion or runny nose . Nausea or vomiting . Diarrhea  If you develop fever/cough/breathlessness, please stay home for 10 days with improving symptoms and until you have had 24 hours of no fever (without taking a fever reducer).  Go to the nearest hospital ED for assessment if fever/cough/breathlessness are severe or illness seems like a threat to life.  It is vitally important that if you feel that you have an infection such as this virus or any other virus that you stay home and away from places where you may spread it to others.  You should avoid contact with people age 65 and older.   You should wear a mask or cloth face covering over your nose and mouth if you must be around other  people or animals, including pets (even at home). Try to stay at least 6 feet away from other people. This will protect the people around you.   You may also take acetaminophen (Tylenol) as needed for fever.   Reduce your risk of any infection by using the same precautions used for avoiding the common cold or flu:  . Wash your hands often with soap and warm water for at least 20 seconds.  If soap and water are not readily available, use an alcohol-based hand sanitizer with at least 60% alcohol.  . If coughing or sneezing, cover your mouth and nose by coughing or sneezing into the elbow areas of your shirt or coat, into a tissue or into your sleeve (not your hands). . Avoid shaking hands with others and consider head nods or verbal greetings only. . Avoid touching your eyes, nose, or mouth with unwashed hands.  . Avoid close contact with people who are sick. . Avoid places or events with large numbers of people in one location, like concerts or sporting events. . Carefully consider travel plans you have or are making. . If you are planning any travel outside or inside the [place], visit the CDC's Travelers' Health webpage for the latest health notices. . If you have some symptoms but not all symptoms, continue to monitor at home and seek medical attention if your symptoms worsen. . If you are having a medical emergency, call 911.  HOME CARE . Only take medications as instructed by your medical team. . Drink plenty of fluids and   get plenty of rest. . A steam or ultrasonic humidifier can help if you have congestion.   GET HELP RIGHT AWAY IF YOU HAVE EMERGENCY WARNING SIGNS** FOR COVID-19. If you or someone is showing any of these signs seek emergency medical care immediately. Call 911 or proceed to your closest emergency facility if: . You develop worsening high fever. . Trouble breathing . Bluish lips or face . Persistent pain or pressure in the chest . New confusion . Inability to wake or stay  awake . You cough up blood. . Your symptoms become more severe  **This list is not all possible symptoms. Contact your medical provider for any symptoms that are sever or concerning to you.   MAKE SURE YOU   Understand these instructions.  Will watch your condition.  Will get help right away if you are not doing well or get worse.  Your e-visit answers were reviewed by a board certified advanced clinical practitioner to complete your personal care plan.  Depending on the condition, your plan could have included both over the counter or prescription medications.  If there is a problem please reply once you have received a response from your provider.  Your safety is important to us.  If you have drug allergies check your prescription carefully.    You can use MyChart to ask questions about today's visit, request a non-urgent call back, or ask for a work or school excuse for 24 hours related to this e-Visit. If it has been greater than 24 hours you will need to follow up with your provider, or enter a new e-Visit to address those concerns. You will get an e-mail in the next two days asking about your experience.  I hope that your e-visit has been valuable and will speed your recovery. Thank you for using e-visits.    

## 2019-03-10 NOTE — Progress Notes (Signed)
I have spent 5 minutes in review of e-visit questionnaire, review and updating patient chart, medical decision making and response to patient.   Rashaud Ybarbo Cody Sonyia Muro, PA-C    

## 2019-03-11 ENCOUNTER — Telehealth: Payer: Self-pay

## 2019-03-11 ENCOUNTER — Encounter (INDEPENDENT_AMBULATORY_CARE_PROVIDER_SITE_OTHER): Payer: Self-pay

## 2019-03-11 NOTE — Telephone Encounter (Signed)
Pt c/o fatigue today. Advised pt that if she has worsening fatigue to the point that she has to hold on to objects to maintain balance to call 911. Advised pt to sit on side of bed for a couple of minutes until she is able to get up and walk. Pt verbalized understanding.

## 2019-03-11 NOTE — Telephone Encounter (Signed)
Called pt and LM on Vm to discuss worsening weakness as indicated in her MyChart questionnaire.

## 2019-03-12 ENCOUNTER — Other Ambulatory Visit: Payer: Self-pay

## 2019-03-12 DIAGNOSIS — Z20822 Contact with and (suspected) exposure to covid-19: Secondary | ICD-10-CM

## 2019-03-13 LAB — NOVEL CORONAVIRUS, NAA: SARS-CoV-2, NAA: NOT DETECTED

## 2019-03-15 ENCOUNTER — Encounter: Payer: Self-pay | Admitting: Physician Assistant

## 2019-04-28 ENCOUNTER — Telehealth: Payer: Federal, State, Local not specified - PPO | Admitting: Physician Assistant

## 2019-04-28 DIAGNOSIS — B349 Viral infection, unspecified: Secondary | ICD-10-CM | POA: Diagnosis not present

## 2019-04-28 DIAGNOSIS — J029 Acute pharyngitis, unspecified: Secondary | ICD-10-CM

## 2019-04-28 NOTE — Progress Notes (Signed)
We are sorry that you are not feeling well.  Here is how we plan to help!  Your symptoms indicate a likely viral infection (Pharyngitis).   Pharyngitis is inflammation in the back of the throat which can cause a sore throat, scratchiness and sometimes difficulty swallowing.   Pharyngitis is typically caused by a respiratory virus and will just run its course.  Please keep in mind that your symptoms could last up to 10 days.  For throat pain, we recommend over the counter oral pain relief medications such as acetaminophen or aspirin, or anti-inflammatory medications such as ibuprofen or naproxen sodium.  Topical treatments such as oral throat lozenges or sprays may be used as needed.  Avoid close contact with loved ones, especially the very young and elderly.  Remember to wash your hands thoroughly throughout the day as this is the number one way to prevent the spread of infection and wipe down door knobs and counters with disinfectant.  After careful review of your answers, I would not recommend and antibiotic for your condition.  Antibiotics should not be used to treat conditions that we suspect are caused by viruses like the virus that causes the common cold or flu. However, some people can have Strep with atypical symptoms. You may need formal testing in clinic or office to confirm if your symptoms continue or worsen.  Providers prescribe antibiotics to treat infections caused by bacteria. Antibiotics are very powerful in treating bacterial infections when they are used properly.  To maintain their effectiveness, they should be used only when necessary.  Overuse of antibiotics has resulted in the development of super bugs that are resistant to treatment!    Home Care:  Only take medications as instructed by your medical team.  Do not drink alcohol while taking these medications.  A steam or ultrasonic humidifier can help congestion.  You can place a towel over your head and breathe in the steam from  hot water coming from a faucet.  Avoid close contacts especially the very young and the elderly.  Cover your mouth when you cough or sneeze.  Always remember to wash your hands.  Get Help Right Away If:  You develop worsening fever or throat pain.  You develop a severe head ache or visual changes.  Your symptoms persist after you have completed your treatment plan.  Make sure you  Understand these instructions.  Will watch your condition.  Will get help right away if you are not doing well or get worse.  Your e-visit answers were reviewed by a board certified advanced clinical practitioner to complete your personal care plan.  Depending on the condition, your plan could have included both over the counter or prescription medications.  If there is a problem please reply  once you have received a response from your provider.  Your safety is important to [place].  If you have drug allergies check your prescription carefully.    You can use MyChart to ask questions about todays visit, request a non-urgent call back, or ask for a work or school excuse for 24 hours related to this e-Visit. If it has been greater than 24 hours you will need to follow up with your provider, or enter a new e-Visit to address those concerns.  You will get an e-mail in the next two days asking about your experience.  I hope that your e-visit has been valuable and will speed your recovery. Thank you for using e-visits.  Prudy Feeler PA-C  Approximately  5 minutes was spent documenting and reviewing patient's chart.    

## 2019-06-06 ENCOUNTER — Telehealth (INDEPENDENT_AMBULATORY_CARE_PROVIDER_SITE_OTHER): Payer: Federal, State, Local not specified - PPO | Admitting: Physician Assistant

## 2019-06-06 ENCOUNTER — Encounter: Payer: Self-pay | Admitting: Physician Assistant

## 2019-06-06 VITALS — Temp 97.4°F | Ht 67.0 in | Wt 232.0 lb

## 2019-06-06 DIAGNOSIS — Z79899 Other long term (current) drug therapy: Secondary | ICD-10-CM | POA: Diagnosis not present

## 2019-06-06 DIAGNOSIS — Z0000 Encounter for general adult medical examination without abnormal findings: Secondary | ICD-10-CM

## 2019-06-06 DIAGNOSIS — N951 Menopausal and female climacteric states: Secondary | ICD-10-CM

## 2019-06-06 DIAGNOSIS — E6609 Other obesity due to excess calories: Secondary | ICD-10-CM

## 2019-06-06 DIAGNOSIS — Z131 Encounter for screening for diabetes mellitus: Secondary | ICD-10-CM

## 2019-06-06 DIAGNOSIS — Z1231 Encounter for screening mammogram for malignant neoplasm of breast: Secondary | ICD-10-CM

## 2019-06-06 DIAGNOSIS — Z6836 Body mass index (BMI) 36.0-36.9, adult: Secondary | ICD-10-CM

## 2019-06-06 DIAGNOSIS — Z13220 Encounter for screening for lipoid disorders: Secondary | ICD-10-CM

## 2019-06-06 DIAGNOSIS — R635 Abnormal weight gain: Secondary | ICD-10-CM | POA: Insufficient documentation

## 2019-06-06 DIAGNOSIS — M722 Plantar fascial fibromatosis: Secondary | ICD-10-CM

## 2019-06-06 MED ORDER — DICLOFENAC SODIUM 1 % EX GEL: 4.0000 g | Freq: Four times a day (QID) | CUTANEOUS | 5 refills | Status: DC

## 2019-06-06 NOTE — Progress Notes (Signed)
No issues.  Needs refill of diclofenac. PHQ9 (2) -GAD7 (0) completed.

## 2019-06-06 NOTE — Progress Notes (Signed)
Patient ID: Rachel [name], female   DOB: May 27, 1970, 49 y.o.   MRN: [id number] .Marland KitchenVirtual Visit via Video Note  I connected with Rachel [name] on 06/06/19 at 10:30 AM EST by a video enabled telemedicine application and verified that I am speaking with the correct person using two identifiers.  Location: Patient: home Provider: clinic   I discussed the limitations of evaluation and management by telemedicine and the availability of in person appointments. The patient expressed understanding and agreed to proceed.  History of Present Illness: Pt is a 49 yo female who presents to the clinic for routine yearly exam.   Pt is doing pretty good. Her mood is good. She continues to have intermittent trouble with plantar fasciitis and uses diclofenac as needed.   She is noticing more hot flashes, mood changes, and weight gain. She started progesterone cream and is helping. Wants to know what progesterone level is.   .. Active Ambulatory Problems    Diagnosis Date Noted  . Morton's neuroma of left foot 03/10/2015  . Metatarsal deformity 03/10/2015  . Plantar fasciitis, bilateral 03/10/2015  . Pronation deformity of both feet 04/10/2015  . Class 2 obesity due to excess calories without serious comorbidity with body mass index (BMI) of 36.0 to 36.9 in adult 08/19/2017  . Allergic rhinitis 12/17/2010  . Premenstrual syndrome 12/17/2010  . Tonsil stone 12/17/2010  . Mass of right eye 01/13/2018  . Blepharitis of right upper eyelid 01/13/2018  . Accessory skin tags 01/13/2018  . Menopausal symptoms 05/05/2018  . Abnormal weight gain 06/06/2019  . Perimenopausal 06/06/2019   Resolved Ambulatory Problems    Diagnosis Date Noted  . No Resolved Ambulatory Problems   Past Medical History:  Diagnosis Date  . Allergy   . Obesity    Reviewed med, allergy, problem list.     Observations/Objective: obese No acute distress.  Normal mood and appearance.   .. Today's Vitals   06/06/19 0959   Temp: (!) 97.4 F (36.3 C)  TempSrc: Oral  Weight: 232 lb (105.2 kg)  Height: 5\' 7"  (1.702 m)   Body mass index is 36.34 kg/m.  .. Depression screen [facility] 2/9 06/06/2019 05/05/2018 08/19/2017 01/05/2017  Decreased Interest 0 0 0 0  Down, Depressed, Hopeless 0 0 0 0  PHQ - 2 Score 0 0 0 0  Altered sleeping 0 0 0 -  Tired, decreased energy 1 1 0 -  Change in appetite 1 1 0 -  Feeling bad or failure about yourself  0 3 0 -  Trouble concentrating 0 0 0 -  Moving slowly or fidgety/restless 0 0 0 -  Suicidal thoughts 0 0 0 -  PHQ-9 Score 2 5 0 -  Difficult doing work/chores Not difficult at all Not difficult at all Not difficult at all -   .. GAD 7 : Generalized Anxiety Score 06/06/2019 05/05/2018 08/19/2017  Nervous, Anxious, on Edge 0 0 0  Control/stop worrying 0 0 0  Worry too much - different things 0 0 0  Trouble relaxing 0 0 0  Restless 0 0 0  Easily annoyed or irritable 0 3 0  Afraid - awful might happen 0 0 0  Total GAD 7 Score 0 3 0  Anxiety Difficulty Not difficult at all Not difficult at all -       Assessment and Plan: 7/10/2019Marland KitchenCalia was seen today for annual exam.  Diagnoses and all orders for this visit:  Routine physical examination -     MM 3D SCREEN  BREAST BILATERAL -     Lipid Panel w/reflex Direct LDL -     TSH -     COMPLETE METABOLIC PANEL WITH GFR -     Progesterone -     CBC  Perimenopausal -     Progesterone  Medication management -     Progesterone  Screening for lipid disorders -     Lipid Panel w/reflex Direct LDL  Class 2 obesity due to excess calories without serious comorbidity with body mass index (BMI) of 36.0 to 36.9 in adult -     TSH  Abnormal weight gain -     TSH  Screening for diabetes mellitus -     COMPLETE METABOLIC PANEL WITH GFR  Visit for screening mammogram -     MM 3D SCREEN BREAST BILATERAL  Plantar fasciitis, bilateral -     diclofenac Sodium (VOLTAREN) 1 % GEL; Apply 4 g topically 4 (four) times daily. To affected  joint.   .. Discussed 150 minutes of exercise a week.  Encouraged vitamin D 1000 units and Calcium 1300mg  or 4 servings of dairy a day.  Fasting labs ordered.  mammo ordered.  Pap UTD. Declined STD testing.  Flu shot UTD.   Ordered labs to check progesterone since started supplement.   Marland Kitchen.Discussed low carb diet with 1500 calories and 80g of protein.  Exercising at least 150 minutes a week.  [employer]itness Pal could be a Microbiologist.   Refilled diclofenac for as needed usage.     Follow Up Instructions:    I discussed the assessment and treatment plan with the patient. The patient was provided an opportunity to ask questions and all were answered. The patient agreed with the plan and demonstrated an understanding of the instructions.   The patient was advised to call back or seek an in-person evaluation if the symptoms worsen or if the condition fails to improve as anticipated.  I provided 25 minutes of non-face-to-face time during this encounter.   Iran Planas, PA-C

## 2019-06-15 ENCOUNTER — Encounter: Payer: Self-pay | Admitting: Physician Assistant

## 2019-06-15 ENCOUNTER — Telehealth: Payer: Federal, State, Local not specified - PPO | Admitting: Physician Assistant

## 2019-06-15 DIAGNOSIS — J019 Acute sinusitis, unspecified: Secondary | ICD-10-CM | POA: Diagnosis not present

## 2019-06-15 DIAGNOSIS — E6609 Other obesity due to excess calories: Secondary | ICD-10-CM | POA: Diagnosis not present

## 2019-06-15 DIAGNOSIS — Z6836 Body mass index (BMI) 36.0-36.9, adult: Secondary | ICD-10-CM | POA: Diagnosis not present

## 2019-06-15 DIAGNOSIS — Z13220 Encounter for screening for lipoid disorders: Secondary | ICD-10-CM | POA: Diagnosis not present

## 2019-06-15 DIAGNOSIS — M722 Plantar fascial fibromatosis: Secondary | ICD-10-CM | POA: Diagnosis not present

## 2019-06-15 DIAGNOSIS — Z0000 Encounter for general adult medical examination without abnormal findings: Secondary | ICD-10-CM | POA: Diagnosis not present

## 2019-06-15 MED ORDER — AMOXICILLIN-POT CLAVULANATE 875-125 MG PO TABS: 1.0000 | ORAL_TABLET | Freq: Two times a day (BID) | ORAL | 0 refills | Status: AC

## 2019-06-15 NOTE — Progress Notes (Signed)

## 2019-06-16 LAB — COMPLETE METABOLIC PANEL WITH GFR
AG Ratio: 1.6 (calc) (ref 1.0–2.5)
ALT: 20 U/L (ref 6–29)
AST: 17 U/L (ref 10–35)
Albumin: 4.3 g/dL (ref 3.6–5.1)
Alkaline phosphatase (APISO): 96 U/L (ref 31–125)
BUN: 14 mg/dL (ref 7–25)
CO2: 27 mmol/L (ref 20–32)
Calcium: 9.8 mg/dL (ref 8.6–10.2)
Chloride: 104 mmol/L (ref 98–110)
Creat: 0.78 mg/dL (ref 0.50–1.10)
GFR, Est African American: 104 mL/min/{1.73_m2} (ref >=60)
GFR, Est Non African American: 90 mL/min/{1.73_m2} (ref >=60)
Globulin: 2.7 g/dL (calc) (ref 1.9–3.7)
Glucose, Bld: 83 mg/dL (ref 65–99)
Potassium: 4.6 mmol/L (ref 3.5–5.3)
Sodium: 140 mmol/L (ref 135–146)
Total Bilirubin: 0.4 mg/dL (ref 0.2–1.2)
Total Protein: 7 g/dL (ref 6.1–8.1)

## 2019-06-16 LAB — CBC
HCT: 41.1 % (ref 35.0–45.0)
Hemoglobin: 13.6 g/dL (ref 11.7–15.5)
MCH: 28.9 pg (ref 27.0–33.0)
MCHC: 33.1 g/dL (ref 32.0–36.0)
MCV: 87.3 fL (ref 80.0–100.0)
MPV: 9.7 fL (ref 7.5–12.5)
Platelets: 366 10*3/uL (ref 140–400)
RBC: 4.71 10*6/uL (ref 3.80–5.10)
RDW: 13.1 % (ref 11.0–15.0)
WBC: 6.2 10*3/uL (ref 3.8–10.8)

## 2019-06-16 LAB — TSH: TSH: 1.01 mIU/L

## 2019-06-16 LAB — LIPID PANEL W/REFLEX DIRECT LDL
Cholesterol: 169 mg/dL (ref <200)
HDL: 60 mg/dL (ref >=50)
LDL Cholesterol (Calc): 92 mg/dL (calc)
Non-HDL Cholesterol (Calc): 109 mg/dL (calc) (ref <130)
Total CHOL/HDL Ratio: 2.8 (calc) (ref <5.0)
Triglycerides: 79 mg/dL (ref <150)

## 2019-06-16 LAB — PROGESTERONE: Progesterone: <0.5 ng/mL

## 2019-06-16 MED ORDER — DICLOFENAC SODIUM 1 % EX GEL: 4.0000 g | Freq: Four times a day (QID) | CUTANEOUS | 5 refills | Status: DC

## 2019-06-16 MED ORDER — DOXYCYCLINE HYCLATE 100 MG PO TABS: 100.0000 mg | ORAL_TABLET | Freq: Two times a day (BID) | ORAL | 0 refills | Status: DC

## 2019-06-16 NOTE — Addendum Note (Signed)
Addended by: Bennie Pierini on: 06/16/2019 05:13 PM   Modules accepted: Orders

## 2019-06-16 NOTE — Addendum Note (Signed)
Addended by: Bennie Pierini on: 06/16/2019 05:26 PM   Modules accepted: Orders

## 2019-06-18 NOTE — Progress Notes (Signed)
Rachel [name],   Cholesterol looks fairly stable. LDL up a little and HDL down a little but in good ranges.  Kidney, liver, glucose look great.  No anemia.  You are not taking too much progesterone. It is still in the post-menopausal range which means pretty low.   -[name]

## 2019-08-09 ENCOUNTER — Emergency Department
Admission: EM | Admit: 2019-08-09 | Discharge: 2019-08-09 | Disposition: A | Discharge status: Home / Self Care | Payer: Federal, State, Local not specified - PPO | Source: Home / Self Care | Attending: Family Medicine | Admitting: Family Medicine

## 2019-08-09 ENCOUNTER — Other Ambulatory Visit: Payer: Self-pay

## 2019-08-09 DIAGNOSIS — J069 Acute upper respiratory infection, unspecified: Secondary | ICD-10-CM

## 2019-08-09 DIAGNOSIS — J302 Other seasonal allergic rhinitis: Secondary | ICD-10-CM | POA: Diagnosis not present

## 2019-08-09 MED ORDER — AMOXICILLIN 875 MG PO TABS: 875.0000 mg | ORAL_TABLET | Freq: Two times a day (BID) | ORAL | 0 refills | Status: DC

## 2019-08-09 MED ORDER — METHYLPREDNISOLONE ACETATE 80 MG/ML IJ SUSP
80.0000 mg | Freq: Once | INTRAMUSCULAR | Status: AC
  Administered 2019-08-09: 80 mg via INTRAMUSCULAR

## 2019-08-09 NOTE — Discharge Instructions (Addendum)
Take plain guaifenesin (1200mg  extended release tabs such as Mucinex) twice daily, with plenty of water, for cough and congestion.  May add Pseudoephedrine (30mg , one or two every 4 to 6 hours) for sinus congestion.  Get adequate rest.   May use Afrin nasal spray (or generic oxymetazoline) each morning for about 5 days and then discontinue.  Also recommend using saline nasal spray several times daily and saline nasal irrigation (AYR is a common brand).  Use Flonase nasal spray each morning after using Afrin nasal spray and saline nasal irrigation. Try warm salt water gargles for sore throat.  Stop all antihistamines (Allegra) for now, and other non-prescription cough/cold preparations. Use albuterol inhaler as needed. May take Tylenol as needed for headache, sore throat, etc. May take Delsym Cough Suppressant at bedtime for nighttime cough.

## 2019-08-09 NOTE — ED Provider Notes (Signed)
[facility]    CSN: [id number] Arrival date & time: 08/09/19  1533      History   Chief Complaint Chief Complaint  Patient presents with  . Sore Throat    HPI Rachel [name] is a 49 y.o. female.   Patient has seasonal allergic rhinitis.  About 10 days ago she developed increased nasal congestion that did not respond to her Allegra.  She then developed fatigue, sore throat, chills, and cough started about 4 days ago.  She denies pleuritic pain but does have occasional chest tightness improved with an albuterol inhaler that was prescribed in the past.  She has no history of asthma, but she does have a family history of asthma (brother).  The history is provided by the patient.    Past Medical History:  Diagnosis Date  . Allergy   . Obesity     Patient Active Problem List   Diagnosis Date Noted  . Abnormal weight gain 06/06/2019  . Perimenopausal 06/06/2019  . Menopausal symptoms 05/05/2018  . Mass of right eye 01/13/2018  . Blepharitis of right upper eyelid 01/13/2018  . Accessory skin tags 01/13/2018  . Class 2 obesity due to excess calories without serious comorbidity with body mass index (BMI) of 36.0 to 36.9 in adult 08/19/2017  . Pronation deformity of both feet 04/10/2015  . Morton's neuroma of left foot 03/10/2015  . Metatarsal deformity 03/10/2015  . Plantar fasciitis, bilateral 03/10/2015  . Allergic rhinitis 12/17/2010  . Premenstrual syndrome 12/17/2010  . Tonsil stone 12/17/2010    Past Surgical History:  Procedure Laterality Date  . TONSILLECTOMY    . WISDOM TOOTH EXTRACTION      OB History    Gravida  2   Para  2   Term  2   Preterm      AB      Living  2     SAB      TAB      Ectopic      Multiple      Live Births               Home Medications    Prior to Admission medications   Medication Sig Start Date End Date Taking? Authorizing Provider  albuterol (VENTOLIN HFA) 108 (90 Base) MCG/ACT inhaler Inhale 2  puffs into the lungs every 6 (six) hours as needed for wheezing or shortness of breath. NEEDS APPT 10/26/18   Withrow, Elyse Jarvis, FNP  amoxicillin (AMOXIL) 875 MG tablet Take 1 tablet (875 mg total) by mouth 2 (two) times daily. 08/09/19   Kandra Nicolas, MD  diclofenac Sodium (VOLTAREN) 1 % GEL Apply 4 g topically 4 (four) times daily. To affected joint. 06/16/19   Hassell Done, Mary-Margaret, FNP  fexofenadine (ALLEGRA) 180 MG tablet Take 180 mg by mouth daily.    [provider]  fluticasone (FLONASE) 50 MCG/ACT nasal spray Place into both nostrils daily.    [provider]  MACA ROOT PO Take by mouth.    [provider]  Misc Natural Products (PROGESTERONE EX) Apply topically.    [provider]  Multiple Vitamin (MULTIVITAMIN) tablet Take 1 tablet by mouth daily.    [provider]  nitroGLYCERIN (NITRODUR - DOSED IN MG/24 HR) 0.2 mg/hr patch APPLY 1/4 PATCH DAILY TO TENDON FOR TENDONITIS Patient not taking: Reported on 06/06/2019 09/22/18   Gregor Hams, MD  vitamin C (ASCORBIC ACID) 250 MG tablet Take 250 mg by mouth daily.  [provider]    Family History Family History  Problem Relation Age of Onset  . Diabetes Mother   . Hypertension Father   . Heart attack Maternal Grandfather   . Heart attack Maternal Uncle   . Diabetes Maternal Aunt     Social History Social History   Tobacco Use  . Smoking status: Former Smoker    Quit date: 09/01/1995    Years since quitting: 23.9  . Smokeless tobacco: Never Used  Substance Use Topics  . Alcohol use: No    Alcohol/week: 0.0 standard drinks  . Drug use: No     Allergies   Patient has no known allergies.   Review of Systems Review of Systems + sore throat + cough No pleuritic pain ? wheezing + nasal congestion + post-nasal drainage + sinus pain/pressure No itchy/red eyes No earache No hemoptysis No SOB No fever, + chills No nausea No vomiting No abdominal pain No  diarrhea No urinary symptoms No skin rash + fatigue No myalgias + headache Used OTC meds (Advil cold/sinus) without relief   Physical Exam Triage Vital Signs ED Triage Vitals  Enc Vitals Group     BP 08/09/19 1546 135/83     Pulse Rate 08/09/19 1546 93     Resp 08/09/19 1546 16     Temp 08/09/19 1546 98.3 F (36.8 C)     Temp Source 08/09/19 1546 Oral     SpO2 08/09/19 1546 98 %     Weight --      Height --      Head Circumference --      Peak Flow --      Pain Score 08/09/19 1542 6     Pain Loc --      Pain Edu? --      Excl. in GC? --    No data found.  Updated Vital Signs BP 135/83 (BP Location: Right Arm)   Pulse 93   Temp 98.3 F (36.8 C) (Oral)   Resp 16   SpO2 98%   Visual Acuity Right Eye Distance:   Left Eye Distance:   Bilateral Distance:    Right Eye Near:   Left Eye Near:    Bilateral Near:     Physical Exam Nursing notes and Vital Signs reviewed. Appearance:  Patient appears stated age, and in no acute distress Eyes:  Pupils are equal, round, and reactive to light and accomodation.  Extraocular movement is intact.  Conjunctivae are not inflamed  Ears:  Canals normal.  Tympanic membranes normal.  Nose:  Congested turbinates.  No sinus tenderness.    Pharynx:  Normal Neck:  Supple.  Mildly enlarged lateral nodes are present, tender to palpation on the left.   Lungs:  Clear to auscultation.  Breath sounds are equal.  Moving air well. Heart:  Regular rate and rhythm without murmurs, rubs, or gallops.  Abdomen:  Nontender without masses or hepatosplenomegaly.  Bowel sounds are present.  No CVA or flank tenderness.  Extremities:  No edema.  Skin:  No rash present.   UC Treatments / Results  Labs (all labs ordered are listed, but only abnormal results are displayed) Labs Reviewed - No data to display  EKG   Radiology No results found.  Procedures Procedures (including critical care time)  Medications Ordered in UC Medications    methylPREDNISolone acetate (DEPO-MEDROL) injection 80 mg (80 mg Intramuscular Given 08/09/19 1627)    Initial Impression / Assessment and Plan / UC Course  I have reviewed the triage vital signs and the nursing notes.  Pertinent labs & imaging results that were available during my care of the patient were reviewed by me and considered in my medical decision making (see chart for details).    ?early sinusitis. Begin amoxicillin.  Administered Depo Medrol 80mg  IM. Followup with Family Doctor if not improved in about 10 days.   Final Clinical Impressions(s) / UC Diagnoses   Final diagnoses:  Viral URI with cough  Seasonal allergic rhinitis, unspecified trigger     Discharge Instructions     Take plain guaifenesin (1200mg  extended release tabs such as Mucinex) twice daily, with plenty of water, for cough and congestion.  May add Pseudoephedrine (30mg , one or two every 4 to 6 hours) for sinus congestion.  Get adequate rest.   May use Afrin nasal spray (or generic oxymetazoline) each morning for about 5 days and then discontinue.  Also recommend using saline nasal spray several times daily and saline nasal irrigation (AYR is a common brand).  Use Flonase nasal spray each morning after using Afrin nasal spray and saline nasal irrigation. Try warm salt water gargles for sore throat.  Stop all antihistamines (Allegra) for now, and other non-prescription cough/cold preparations. Use albuterol inhaler as needed. May take Tylenol as needed for headache, sore throat, etc. May take Delsym Cough Suppressant at bedtime for nighttime cough.      ED Prescriptions    Medication Sig Dispense Auth. Provider   amoxicillin (AMOXIL) 875 MG tablet Take 1 tablet (875 mg total) by mouth 2 (two) times daily. 20 tablet , MD        , MD 08/09/19 [phone]

## 2019-08-09 NOTE — ED Triage Notes (Signed)
Patient presents to Urgent Care with complaints of sore throat and chest congestion since about a week ago. Patient reports she believes it started as seasonal allergies and has continued to bother her, pt has been taking advil cold & sinus at home for her sx.

## 2019-08-14 ENCOUNTER — Telehealth: Payer: Federal, State, Local not specified - PPO | Admitting: Nurse Practitioner

## 2019-08-22 ENCOUNTER — Other Ambulatory Visit: Payer: Self-pay

## 2019-08-22 ENCOUNTER — Ambulatory Visit (INDEPENDENT_AMBULATORY_CARE_PROVIDER_SITE_OTHER): Payer: Federal, State, Local not specified - PPO

## 2019-08-22 DIAGNOSIS — Z1231 Encounter for screening mammogram for malignant neoplasm of breast: Secondary | ICD-10-CM | POA: Diagnosis not present

## 2019-08-22 DIAGNOSIS — Z0000 Encounter for general adult medical examination without abnormal findings: Secondary | ICD-10-CM | POA: Diagnosis not present

## 2019-08-23 NOTE — Progress Notes (Signed)
Mammogram shows no evidence of malignancy. Recommendation is to have next screening mammogram in one year.

## 2019-09-05 ENCOUNTER — Other Ambulatory Visit: Payer: Self-pay

## 2019-09-05 ENCOUNTER — Encounter: Payer: Self-pay | Admitting: Sports Medicine

## 2019-09-05 ENCOUNTER — Ambulatory Visit (INDEPENDENT_AMBULATORY_CARE_PROVIDER_SITE_OTHER): Payer: Federal, State, Local not specified - PPO

## 2019-09-05 ENCOUNTER — Ambulatory Visit: Payer: Federal, State, Local not specified - PPO | Admitting: Sports Medicine

## 2019-09-05 DIAGNOSIS — M25562 Pain in left knee: Secondary | ICD-10-CM | POA: Diagnosis not present

## 2019-09-05 DIAGNOSIS — M170 Bilateral primary osteoarthritis of knee: Secondary | ICD-10-CM

## 2019-09-05 DIAGNOSIS — M1711 Unilateral primary osteoarthritis, right knee: Secondary | ICD-10-CM | POA: Diagnosis not present

## 2019-09-05 NOTE — Assessment & Plan Note (Signed)
For over 3 months this pleasant 49 year old female has had left worse than right knee pain, medial joint line, worse with twisting. She gets occasional effusions, symptoms are not much better with over-the-counter analgesics, or topical NSAIDs. Today we injected both of her knees, updating x-rays, and getting an MRI of the left knee. Meniscal tear rehab exercises given, return to see me in 1 month, referral for arthroscopy if no better.

## 2019-09-05 NOTE — Progress Notes (Signed)
    Procedures performed today:    Procedure: Real-time Ultrasound Guided injection of the left knee Device: Samsung HS60  Verbal informed consent obtained.  Time-out conducted.  Noted no overlying erythema, induration, or other signs of local infection.  Skin prepped in a sterile fashion.  Local anesthesia: Topical Ethyl chloride.  With sterile technique and under real time ultrasound guidance: 1 cc Kenalog 40, 2 cc lidocaine, 2 cc bupivacaine injected easily Completed without difficulty  Pain immediately resolved suggesting accurate placement of the medication.  Advised to call if fevers/chills, erythema, induration, drainage, or persistent bleeding.  Images permanently stored and available for review in the ultrasound unit.  Impression: Technically successful ultrasound guided injection.  Procedure: Real-time Ultrasound Guided injection of the right knee Device: Samsung HS60  Verbal informed consent obtained.  Time-out conducted.  Noted no overlying erythema, induration, or other signs of local infection.  Skin prepped in a sterile fashion.  Local anesthesia: Topical Ethyl chloride.  With sterile technique and under real time ultrasound guidance: 1 cc Kenalog 40, 2 cc lidocaine, 2 cc bupivacaine injected easily Completed without difficulty  Pain immediately resolved suggesting accurate placement of the medication.  Advised to call if fevers/chills, erythema, induration, drainage, or persistent bleeding.  Images permanently stored and available for review in the ultrasound unit.  Impression: Technically successful ultrasound guided injection.  Independent interpretation of notes and tests performed by another provider:   None.  Brief History, Exam, Impression, and Recommendations:    Primary osteoarthritis of both knees For over 3 months this pleasant 49 year old female has had left worse than right knee pain, medial joint line, worse with twisting. She gets occasional  effusions, symptoms are not much better with over-the-counter analgesics, or topical NSAIDs. Today we injected both of her knees, updating x-rays, and getting an MRI of the left knee. Meniscal tear rehab exercises given, return to see me in 1 month, referral for arthroscopy if no better.    ___________________________________________ Ihor Austin. Benjamin Stain, M.D., ABFM., CAQSM. Primary Care and Sports Medicine [facility]  Adjunct Instructor of Family Medicine  [employer]

## 2019-09-08 ENCOUNTER — Other Ambulatory Visit: Payer: Self-pay

## 2019-09-08 ENCOUNTER — Ambulatory Visit (INDEPENDENT_AMBULATORY_CARE_PROVIDER_SITE_OTHER): Payer: Federal, State, Local not specified - PPO

## 2019-09-08 DIAGNOSIS — M170 Bilateral primary osteoarthritis of knee: Secondary | ICD-10-CM

## 2019-09-08 DIAGNOSIS — M25562 Pain in left knee: Secondary | ICD-10-CM | POA: Diagnosis not present

## 2019-09-09 ENCOUNTER — Other Ambulatory Visit: Payer: Federal, State, Local not specified - PPO

## 2019-09-20 ENCOUNTER — Other Ambulatory Visit: Payer: Self-pay

## 2019-09-20 ENCOUNTER — Emergency Department
Admission: EM | Admit: 2019-09-20 | Discharge: 2019-09-20 | Disposition: A | Discharge status: Home / Self Care | Payer: Federal, State, Local not specified - PPO | Source: Home / Self Care

## 2019-09-20 DIAGNOSIS — J069 Acute upper respiratory infection, unspecified: Secondary | ICD-10-CM

## 2019-09-20 MED ORDER — BENZONATATE 100 MG PO CAPS: 100.0000 mg | ORAL_CAPSULE | Freq: Three times a day (TID) | ORAL | 0 refills | Status: DC

## 2019-09-20 MED ORDER — IPRATROPIUM BROMIDE 0.06 % NA SOLN
2.0000 | Freq: Four times a day (QID) | NASAL | 1 refills | Status: DC
Start: 2019-09-20 — End: 2020-04-23

## 2019-09-20 MED ORDER — ALBUTEROL SULFATE HFA 108 (90 BASE) MCG/ACT IN AERS: 1.0000 | INHALATION_SPRAY | Freq: Four times a day (QID) | RESPIRATORY_TRACT | 0 refills | Status: DC | PRN

## 2019-09-20 NOTE — Discharge Instructions (Signed)
°  Please follow up with family medicine in 1 week if not improving, sooner if worsening.  °

## 2019-09-20 NOTE — ED Provider Notes (Signed)
[name]    CSN: [id number] Arrival date & time: 09/20/19  1130      History   Chief Complaint Chief Complaint  Patient presents with  . Nasal Congestion    HPI Rachel [name] is a 49 y.o. female.   HPI  Rachel [name] is a 49 y.o. female presenting to UC with c/o cough with mild nasal congestion for about 2 weeks. She was seen at [facility] about 1 month ago for cough and congestion. She was tx with amoxicillin at that time and was tx with doxycycline in may for a sinus infection.  Pt states symptoms from visit on 4/29 had fully resolved before current cough developed. Pt believes her allergies are to blame.  She has used in albuterol inhaler with temporary relief.  Her daughter also had similar symptoms, which resolved in 2-3 weeks.  Denies fever, chills, n/v/d. No SOB. She has received her Covid-19 vaccine. She is leaving for a 10 day trip out of the country soon and wanted to make sure she was okay to go.   Past Medical History:  Diagnosis Date  . Allergy   . Obesity     Patient Active Problem List   Diagnosis Date Noted  . Primary osteoarthritis of both knees 09/05/2019  . Abnormal weight gain 06/06/2019  . Perimenopausal 06/06/2019  . Menopausal symptoms 05/05/2018  . Mass of right eye 01/13/2018  . Blepharitis of right upper eyelid 01/13/2018  . Accessory skin tags 01/13/2018  . Class 2 obesity due to excess calories without serious comorbidity with body mass index (BMI) of 36.0 to 36.9 in adult 08/19/2017  . Pronation deformity of both feet 04/10/2015  . Morton's neuroma of left foot 03/10/2015  . Metatarsal deformity 03/10/2015  . Plantar fasciitis, bilateral 03/10/2015  . Allergic rhinitis 12/17/2010  . Premenstrual syndrome 12/17/2010  . Tonsil stone 12/17/2010    Past Surgical History:  Procedure Laterality Date  . TONSILLECTOMY    . WISDOM TOOTH EXTRACTION      OB History    Gravida  2   Para  2   Term  2   Preterm      AB      Living    2     SAB      TAB      Ectopic      Multiple      Live Births               Home Medications    Prior to Admission medications   Medication Sig Start Date End Date Taking? Authorizing Provider  albuterol (VENTOLIN HFA) 108 (90 Base) MCG/ACT inhaler Inhale 1-2 puffs into the lungs every 6 (six) hours as needed for wheezing or shortness of breath. 09/20/19   Lurene Shadow, PA-C  benzonatate (TESSALON) 100 MG capsule Take 1-2 capsules (100-200 mg total) by mouth every 8 (eight) hours. 09/20/19   Lurene Shadow, PA-C  diclofenac Sodium (VOLTAREN) 1 % GEL Apply 4 g topically 4 (four) times daily. To affected joint. 06/16/19   Daphine Deutscher, Mary-Margaret, FNP  fluticasone (FLONASE) 50 MCG/ACT nasal spray Place into both nostrils daily.    [provider]  ipratropium (ATROVENT) 0.06 % nasal spray Place 2 sprays into both nostrils 4 (four) times daily. 09/20/19   Lurene Shadow, PA-C  MACA ROOT PO Take by mouth.    [provider]  Multiple Vitamin (MULTIVITAMIN) tablet Take 1 tablet by mouth daily.    [provider]    Family History Family History  Problem Relation Age of Onset  . Healthy Mother   . Hypertension Father   . Heart attack Maternal Grandfather   . Heart attack Maternal Uncle   . Diabetes Maternal Aunt     Social History Social History   Tobacco Use  . Smoking status: Former Smoker    Quit date: 09/01/1995    Years since quitting: 24.0  . Smokeless tobacco: Never Used  Substance Use Topics  . Alcohol use: No    Alcohol/week: 0.0 standard drinks  . Drug use: No     Allergies   Patient has no known allergies.   Review of Systems Review of Systems  Constitutional: Negative for chills and fever.  HENT: Positive for congestion and postnasal drip. Negative for ear pain, sore throat, trouble swallowing and voice change.   Respiratory: Positive for cough. Negative for shortness of breath.   Cardiovascular: Negative for chest pain and  palpitations.  Gastrointestinal: Negative for abdominal pain, diarrhea, nausea and vomiting.  Musculoskeletal: Negative for arthralgias, back pain and myalgias.  Skin: Negative for rash.  Neurological: Negative for dizziness, light-headedness and headaches.  All other systems reviewed and are negative.    Physical Exam Triage Vital Signs ED Triage Vitals  Enc Vitals Group     BP 09/20/19 1147 129/86     Pulse Rate 09/20/19 1147 66     Resp 09/20/19 1147 16     Temp 09/20/19 1147 97.9 F (36.6 C)     Temp Source 09/20/19 1147 Oral     SpO2 09/20/19 1147 99 %     Weight --      Height --      Head Circumference --      Peak Flow --      Pain Score 09/20/19 1145 0     Pain Loc --      Pain Edu? --      Excl. in [place]? --    No data found.  Updated Vital Signs BP 129/86 (BP Location: Right Arm)   Pulse 66   Temp 97.9 F (36.6 C) (Oral)   Resp 16   SpO2 99%   Visual Acuity Right Eye Distance:   Left Eye Distance:   Bilateral Distance:    Right Eye Near:   Left Eye Near:    Bilateral Near:     Physical Exam Vitals and nursing note reviewed.  Constitutional:      General: She is not in acute distress.    Appearance: Normal appearance. She is well-developed. She is not ill-appearing, toxic-appearing or diaphoretic.  HENT:     Head: Normocephalic and atraumatic.     Right Ear: Tympanic membrane and ear canal normal.     Left Ear: Tympanic membrane and ear canal normal.     Nose: Nose normal.     Right Sinus: No maxillary sinus tenderness or frontal sinus tenderness.     Left Sinus: No maxillary sinus tenderness or frontal sinus tenderness.     Mouth/Throat:     Lips: Pink.     Mouth: Mucous membranes are moist.     Pharynx: Oropharynx is clear. Uvula midline.  Cardiovascular:     Rate and Rhythm: Normal rate and regular rhythm.  Pulmonary:     Effort: Pulmonary effort is normal. No respiratory distress.     Breath sounds: Normal breath sounds. No stridor. No  wheezing, rhonchi or rales.  Musculoskeletal:  General: Normal range of motion.     Cervical back: Normal range of motion.  Skin:    General: Skin is warm and dry.  Neurological:     Mental Status: She is alert and oriented to person, place, and time.  Psychiatric:        Behavior: Behavior normal.      UC Treatments / Results  Labs (all labs ordered are listed, but only abnormal results are displayed) Labs Reviewed - No data to display  EKG   Radiology No results found.  Procedures Procedures (including critical care time)  Medications Ordered in UC Medications - No data to display  Initial Impression / Assessment and Plan / UC Course  I have reviewed the triage vital signs and the nursing notes.  Pertinent labs & imaging results that were available during my care of the patient were reviewed by me and considered in my medical decision making (see chart for details).     No evidence of bacterial infection at this time Encouraged symptomatic tx F/u with PCP as needed AVS provided  Final Clinical Impressions(s) / UC Diagnoses   Final diagnoses:  Viral URI with cough     Discharge Instructions      Please follow up with family medicine in 1 week if not improving, sooner if worsening.    ED Prescriptions    Medication Sig Dispense Auth. Provider   benzonatate (TESSALON) 100 MG capsule Take 1-2 capsules (100-200 mg total) by mouth every 8 (eight) hours. 21 capsule Doroteo Glassman, Yulianna Folse O, PA-C   ipratropium (ATROVENT) 0.06 % nasal spray Place 2 sprays into both nostrils 4 (four) times daily. 15 mL [place] Gadbois O, PA-C   albuterol (VENTOLIN HFA) 108 (90 Base) MCG/ACT inhaler Inhale 1-2 puffs into the lungs every 6 (six) hours as needed for wheezing or shortness of breath. 8 g Lurene Shadow, PA-C     PDMP not reviewed this encounter.   Lurene Shadow, [place] 09/20/19 1310

## 2019-09-20 NOTE — ED Triage Notes (Signed)
Patient presents to Urgent Care with complaints of cough and nasal congestion since about 2 months ago. Patient reports she was seen in the past recently for same, has been using her inhaler daily.

## 2019-10-10 ENCOUNTER — Ambulatory Visit (INDEPENDENT_AMBULATORY_CARE_PROVIDER_SITE_OTHER): Payer: Federal, State, Local not specified - PPO | Admitting: Sports Medicine

## 2019-10-10 ENCOUNTER — Telehealth: Payer: Self-pay | Admitting: Sports Medicine

## 2019-10-10 ENCOUNTER — Other Ambulatory Visit: Payer: Self-pay

## 2019-10-10 ENCOUNTER — Encounter: Payer: Self-pay | Admitting: Sports Medicine

## 2019-10-10 DIAGNOSIS — M170 Bilateral primary osteoarthritis of knee: Secondary | ICD-10-CM

## 2019-10-10 NOTE — Progress Notes (Signed)
    Procedures performed today:    None.  Independent interpretation of notes and tests performed by another provider:   None.  Brief History, Exam, Impression, and Recommendations:    Primary osteoarthritis of both knees Patience returns, she is a pleasant 49 year old female with bilateral knee pain, left worse than right, at the last visit we injected both of her knees and got an MRI. She has osteoarthritis with several areas of full-thickness cartilage loss, she also has some meniscal fraying. She had good and partial relief with the bilateral steroid injections, because of persistent pain we are going to proceed with viscosupplementation approval, she will get more diligent with her rehab exercises, ice the knees after exercise, and she will call me if she wants tramadol. Return to see me to start Orthovisc when approved.    ___________________________________________ Ihor Austin. Benjamin Stain, M.D., ABFM., CAQSM. Primary Care and Sports Medicine [facility]  Adjunct Instructor of Family Medicine  [employer]

## 2019-10-10 NOTE — Telephone Encounter (Signed)
Bilateral Orthovisc approval please, x-ray confirmed, failed steroid injections, oral analgesics.

## 2019-10-10 NOTE — Assessment & Plan Note (Signed)
Rachel [name] returns, she is a pleasant 49 year old female with bilateral knee pain, left worse than right, at the last visit we injected both of her knees and got an MRI. She has osteoarthritis with several areas of full-thickness cartilage loss, she also has some meniscal fraying. She had good and partial relief with the bilateral steroid injections, because of persistent pain we are going to proceed with viscosupplementation approval, she will get more diligent with her rehab exercises, ice the knees after exercise, and she will call me if she wants tramadol. Return to see me to start Orthovisc when approved.

## 2019-10-10 NOTE — Telephone Encounter (Signed)
Started Orthovisc Case and PA for approval - CF

## 2019-12-24 ENCOUNTER — Encounter: Payer: Self-pay | Admitting: Physician Assistant

## 2019-12-24 ENCOUNTER — Telehealth (INDEPENDENT_AMBULATORY_CARE_PROVIDER_SITE_OTHER): Payer: Federal, State, Local not specified - PPO | Admitting: Physician Assistant

## 2019-12-24 VITALS — Temp 98.1°F

## 2019-12-24 DIAGNOSIS — R0981 Nasal congestion: Secondary | ICD-10-CM | POA: Diagnosis not present

## 2019-12-24 DIAGNOSIS — J029 Acute pharyngitis, unspecified: Secondary | ICD-10-CM

## 2019-12-24 DIAGNOSIS — Z20822 Contact with and (suspected) exposure to covid-19: Secondary | ICD-10-CM

## 2019-12-24 NOTE — Progress Notes (Signed)
Patient ID: Rachel [name], female   DOB: 03-14-1971, 49 y.o.   MRN: [id number]  .Marland KitchenVirtual Visit via Video Note  I connected with Rachel [name] on 12/24/19 at  1:00 PM EDT by a video enabled telemedicine application and verified that I am speaking with the correct person using two identifiers.  Location: Patient: home Provider: clinic   I discussed the limitations of evaluation and management by telemedicine and the availability of in person appointments. The patient expressed understanding and agreed to proceed.  History of Present Illness: Pt is a 49 yo obese female who calls into the clinic with 3 days of sore throat and postnasal drip.  Symptoms have stayed about the same and may be a little better today.  She denies any fever, chills, shortness of breath, wheezing, cough, loss of smell or taste, GI symptoms.  She has had some body aches and general feelings of fatigue.  Her sore throat seems to be worse in the morning and worse at night but improved during the day.  She has had the majority vaccine in March.  She denies any household sick contacts.  She denies any known Covid exposure.  She did travel outside the country and returned on Sunday before her symptoms started.  She did have a Covid negative test at that time.  She has been taking over-the-counter treatments and helping minimally. She does not have her tonsils and denies any exudate.   .. Active Ambulatory Problems    Diagnosis Date Noted   Morton's neuroma of left foot 03/10/2015   Metatarsal deformity 03/10/2015   Plantar fasciitis, bilateral 03/10/2015   Pronation deformity of both feet 04/10/2015   Class 2 obesity due to excess calories without serious comorbidity with body mass index (BMI) of 36.0 to 36.9 in adult 08/19/2017   Allergic rhinitis 12/17/2010   Premenstrual syndrome 12/17/2010   Tonsil stone 12/17/2010   Mass of right eye 01/13/2018   Blepharitis of right upper eyelid 01/13/2018   Accessory skin  tags 01/13/2018   Menopausal symptoms 05/05/2018   Abnormal weight gain 06/06/2019   Perimenopausal 06/06/2019   Primary osteoarthritis of both knees 09/05/2019   Resolved Ambulatory Problems    Diagnosis Date Noted   No Resolved Ambulatory Problems   Past Medical History:  Diagnosis Date   Allergy    Obesity    Reviewed med, allergy, problem list.   Observations/Objective: No acute distress Normal mood and appearance.  No labored breathing  .Marland KitchenThere were no vitals filed for this visit. There is no height or weight on file to calculate BMI.  Temperature-98.1.   Assessment and Plan: Marland KitchenMarland KitchenWitney was seen today for sore throat and nasal congestion.  Diagnoses and all orders for this visit:  Suspected COVID-19 virus infection  Sore throat -     Novel Coronavirus, NAA ([facility])  Head congestion -     Novel Coronavirus, NAA ([facility])   Patient symptoms are suspicious for Covid infection especially with her international travel.  We will get PCR testing done today.  Patient should quarantine and treat symptomatically until test results are in.  Discussed symptomatic treatment of ibuprofen, Tylenol, salt water gargles, zinc, vitamin C, rest, hydration, Tylenol Cold sinus severe. If patient has any sudden symptoms of SOB or breathing issues call office, go to UC or ED. Pt does not have tonsils which makes strep throat less likely and the fact that it gets better during the day. Likely viral etiology. Call with any changes.    Follow Up  Instructions:    I discussed the assessment and treatment plan with the patient. The patient was provided an opportunity to ask questions and all were answered. The patient agreed with the plan and demonstrated an understanding of the instructions.   The patient was advised to call back or seek an in-person evaluation if the symptoms worsen or if the condition fails to improve as anticipated.  I provided 10 minutes of non-face-to-face time  during this encounter.   Tandy Gaw, PA-C

## 2019-12-26 LAB — SARS-COV-2, NAA 2 DAY TAT

## 2019-12-26 LAB — NOVEL CORONAVIRUS, NAA: SARS-CoV-2, NAA: NOT DETECTED

## 2019-12-26 NOTE — Progress Notes (Signed)
Negative for covid

## 2019-12-28 ENCOUNTER — Telehealth: Payer: Self-pay

## 2019-12-28 MED ORDER — AMOXICILLIN-POT CLAVULANATE 875-125 MG PO TABS
1.0000 | ORAL_TABLET | Freq: Two times a day (BID) | ORAL | 0 refills | Status: DC
Start: 2019-12-28 — End: 2020-03-17

## 2019-12-28 NOTE — Telephone Encounter (Signed)
Task completed. Pt aware to pick up antibiotic rx from local pharmacy. Pt will keep [place] updated as needed.

## 2019-12-28 NOTE — Telephone Encounter (Signed)
Sent antibiotic to pharmacy to start.

## 2019-12-28 NOTE — Telephone Encounter (Signed)
Pt returned a call back to the clinic as suggested by provider. Per pt, her symptoms are not getting any better. Symptoms are pretty much the same since her virtual visit on 12/24/19. Requesting recommendations from provider. Pls advise, thanks.

## 2020-01-09 NOTE — Telephone Encounter (Signed)
I called patient and at this time she is going to hold off and will call us if she decides to get the injections. - CF

## 2020-01-09 NOTE — Telephone Encounter (Signed)
Received benefits investigation detail Patient will be responsible for paying out of pocket until deductible is met then injection and medication will be covered at 100%. Prior authorization is required. - CF

## 2020-03-05 ENCOUNTER — Ambulatory Visit (INDEPENDENT_AMBULATORY_CARE_PROVIDER_SITE_OTHER): Payer: Federal, State, Local not specified - PPO | Admitting: Psychology

## 2020-03-05 DIAGNOSIS — F331 Major depressive disorder, recurrent, moderate: Secondary | ICD-10-CM

## 2020-03-10 ENCOUNTER — Ambulatory Visit (INDEPENDENT_AMBULATORY_CARE_PROVIDER_SITE_OTHER): Payer: Federal, State, Local not specified - PPO | Admitting: Psychology

## 2020-03-10 DIAGNOSIS — F331 Major depressive disorder, recurrent, moderate: Secondary | ICD-10-CM

## 2020-03-17 ENCOUNTER — Other Ambulatory Visit: Payer: Self-pay

## 2020-03-17 ENCOUNTER — Emergency Department: Admit: 2020-03-17 | Discharge status: Absent without leave | Payer: Self-pay

## 2020-03-17 ENCOUNTER — Emergency Department (INDEPENDENT_AMBULATORY_CARE_PROVIDER_SITE_OTHER)
Admission: EM | Admit: 2020-03-17 | Discharge: 2020-03-17 | Disposition: A | Discharge status: Home / Self Care | Payer: Federal, State, Local not specified - PPO | Source: Home / Self Care

## 2020-03-17 DIAGNOSIS — J0100 Acute maxillary sinusitis, unspecified: Secondary | ICD-10-CM

## 2020-03-17 DIAGNOSIS — J069 Acute upper respiratory infection, unspecified: Secondary | ICD-10-CM

## 2020-03-17 DIAGNOSIS — H6693 Otitis media, unspecified, bilateral: Secondary | ICD-10-CM

## 2020-03-17 MED ORDER — AMOXICILLIN-POT CLAVULANATE 875-125 MG PO TABS: 1.0000 | ORAL_TABLET | Freq: Two times a day (BID) | ORAL | 0 refills | Status: DC

## 2020-03-17 NOTE — ED Triage Notes (Signed)
Patient presents to Urgent Care with complaints of sinus drainage and sore throat since about 2 weeks ago. Patient reports a cough has developed as well (dry). Pt has been using otc medications for her symptoms but they continue.  Pt has been vaccinated for covid and flu.

## 2020-03-17 NOTE — ED Provider Notes (Signed)
Rachel [name] C[facility]    CSN: [id number] Arrival date & time: 03/17/20  1519      History   Chief Complaint Chief Complaint  Patient presents with  . Nasal Congestion  . Sore Throat    HPI Rachel [name] is a 49 y.o. female.   HPI Rachel [name] is a 50 y.o. female presenting to UC with c/o 2 weeks of sinus congestion and drainage developing into bilateral ear pressure and sinus pain.  Minimal cough from post-nasal drainage.  She has taken OTC medication including Delsym with temporary relief. Denies fever, chills, n/v/d. Her husband was dx with a viral URI recently. Vaccinated for COVID and flu.   Past Medical History:  Diagnosis Date  . Allergy   . Obesity     Patient Active Problem List   Diagnosis Date Noted  . Primary osteoarthritis of both knees 09/05/2019  . Abnormal weight gain 06/06/2019  . Perimenopausal 06/06/2019  . Menopausal symptoms 05/05/2018  . Mass of right eye 01/13/2018  . Blepharitis of right upper eyelid 01/13/2018  . Accessory skin tags 01/13/2018  . Class 2 obesity due to excess calories without serious comorbidity with body mass index (BMI) of 36.0 to 36.9 in adult 08/19/2017  . Pronation deformity of both feet 04/10/2015  . Morton's neuroma of left foot 03/10/2015  . Metatarsal deformity 03/10/2015  . Plantar fasciitis, bilateral 03/10/2015  . Allergic rhinitis 12/17/2010  . Premenstrual syndrome 12/17/2010  . Tonsil stone 12/17/2010    Past Surgical History:  Procedure Laterality Date  . TONSILLECTOMY    . WISDOM TOOTH EXTRACTION      OB History    Gravida  2   Para  2   Term  2   Preterm      AB      Living  2     SAB      TAB      Ectopic      Multiple      Live Births               Home Medications    Prior to Admission medications   Medication Sig Start Date End Date Taking? Authorizing Provider  albuterol (VENTOLIN HFA) 108 (90 Base) MCG/ACT inhaler Inhale 1-2 puffs into the lungs every 6 (six)  hours as needed for wheezing or shortness of breath. 09/20/19   Lurene Shadow, PA-C  amoxicillin-clavulanate (AUGMENTIN) 875-125 MG tablet Take 1 tablet by mouth 2 (two) times daily. One po bid x 7 days 03/17/20   Lurene Shadow, PA-C  diclofenac Sodium (VOLTAREN) 1 % GEL Apply 4 g topically 4 (four) times daily. To affected joint. 06/16/19   Daphine Deutscher, Mary-Margaret, FNP  fluticasone (FLONASE) 50 MCG/ACT nasal spray Place into both nostrils daily.    [provider]  ipratropium (ATROVENT) 0.06 % nasal spray Place 2 sprays into both nostrils 4 (four) times daily. 09/20/19   Lurene Shadow, PA-C  MACA ROOT PO Take by mouth.    [provider]  Multiple Vitamin (MULTIVITAMIN) tablet Take 1 tablet by mouth daily.    [provider]    Family History Family History  Problem Relation Age of Onset  . Healthy Mother   . Hypertension Father   . Heart attack Maternal Grandfather   . Heart attack Maternal Uncle   . Diabetes Maternal Aunt     Social History Social History   Tobacco Use  . 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 Smokeless tobacco: Never Used  Substance Use Topics  . Alcohol use: Yes    Alcohol/week: 0.0 standard drinks    Comment: rare  . Drug use: No     Allergies   Patient has no known allergies.   Review of Systems Review of Systems  Constitutional: Negative for chills and fever.  HENT: Positive for congestion, ear pain, postnasal drip, sinus pressure and sinus pain. Negative for sore throat, trouble swallowing and voice change.   Respiratory: Positive for cough. Negative for shortness of breath.   Cardiovascular: Negative for chest pain and palpitations.  Gastrointestinal: Negative for abdominal pain, diarrhea, nausea and vomiting.  Musculoskeletal: Negative for arthralgias, back pain and myalgias.  Skin: Negative for rash.  Neurological: Positive for headaches. Negative for dizziness and  light-headedness.  All other systems reviewed and are negative.    Physical Exam Triage Vital Signs ED Triage Vitals  Enc Vitals Group     BP 03/17/20 1544 119/79     Pulse Rate 03/17/20 1544 81     Resp 03/17/20 1544 16     Temp 03/17/20 1544 (!) 97.5 F (36.4 C)     Temp Source 03/17/20 1544 Oral     SpO2 03/17/20 1544 97 %     Weight 03/17/20 1542 220 lb (99.8 kg)     Height 03/17/20 1542 5\' 7"  (1.702 m)     Head Circumference --      Peak Flow --      Pain Score 03/17/20 1542 5     Pain Loc --      Pain Edu? --      Excl. in GC? --    No data found.  Updated Vital Signs BP 119/79 (BP Location: Right Arm)   Pulse 81   Temp (!) 97.5 F (36.4 C) (Oral)   Resp 16   Ht 5\' 7"  (1.702 m)   Wt 220 lb (99.8 kg)   SpO2 97%   BMI 34.46 kg/m   Visual Acuity Right Eye Distance:   Left Eye Distance:   Bilateral Distance:    Right Eye Near:   Left Eye Near:    Bilateral Near:     Physical Exam Vitals and nursing note reviewed.  Constitutional:      General: She is not in acute distress.    Appearance: She is well-developed. She is not ill-appearing or toxic-appearing.  HENT:     Head: Normocephalic and atraumatic.     Right Ear: Tympanic membrane is erythematous and bulging.     Left Ear: Tympanic membrane is erythematous. Tympanic membrane is not bulging.     Nose:     Right Sinus: Maxillary sinus tenderness present. No frontal sinus tenderness.     Left Sinus: Maxillary sinus tenderness present. No frontal sinus tenderness.     Mouth/Throat:     Lips: Pink.     Mouth: Mucous membranes are moist.     Pharynx: Oropharynx is clear. Uvula midline.  Cardiovascular:     Rate and Rhythm: Normal rate and regular rhythm.  Pulmonary:     Effort: Pulmonary effort is normal. No respiratory distress.     Breath sounds: Normal breath sounds. No stridor. No wheezing, rhonchi or rales.  Musculoskeletal:        General: Normal range of motion.     Cervical back: Normal  range of motion and neck supple.  Lymphadenopathy:     Cervical:  No cervical adenopathy.  Skin:    General: Skin is warm and dry.  Neurological:     Mental Status: She is alert and oriented to person, place, and time.  Psychiatric:        Behavior: Behavior normal.      UC Treatments / Results  Labs (all labs ordered are listed, but only abnormal results are displayed) Labs Reviewed - No data to display  EKG   Radiology No results found.  Procedures Procedures (including critical care time)  Medications Ordered in UC Medications - No data to display  Initial Impression / Assessment and Plan / UC Course  I have reviewed the triage vital signs and the nursing notes.  Pertinent labs & imaging results that were available during my care of the patient were reviewed by me and considered in my medical decision making (see chart for details).    Hx and exam c/w bilateral AOM and sinusitis from URI Rx: augmentin F/u with PCP as needed  Final Clinical Impressions(s) / UC Diagnoses   Final diagnoses:  Bilateral acute otitis media  Acute non-recurrent maxillary sinusitis  Upper respiratory tract infection, unspecified type     Discharge Instructions      Please take antibiotics as prescribed and be sure to complete entire course even if you start to feel better to ensure infection does not come back.  You may take 500mg  acetaminophen every 4-6 hours or in combination with ibuprofen 400-600mg  every 6-8 hours as needed for pain, inflammation, and fever.  Be sure to well hydrated with clear liquids and get at least 8 hours of sleep at night, preferably more while sick.   Please follow up with family medicine in 1 week if needed.     ED Prescriptions    Medication Sig Dispense Auth. Provider   amoxicillin-clavulanate (AUGMENTIN) 875-125 MG tablet Take 1 tablet by mouth 2 (two) times daily. One po bid x 7 days 14 tablet , Lurene Shadow     PDMP not reviewed  this encounter.   [place], Lurene Shadow 03/17/20 1907

## 2020-03-17 NOTE — Discharge Instructions (Signed)

## 2020-03-18 ENCOUNTER — Ambulatory Visit: Payer: Federal, State, Local not specified - PPO | Admitting: Psychology

## 2020-03-25 ENCOUNTER — Ambulatory Visit (INDEPENDENT_AMBULATORY_CARE_PROVIDER_SITE_OTHER): Payer: Federal, State, Local not specified - PPO | Admitting: Psychology

## 2020-03-25 DIAGNOSIS — F330 Major depressive disorder, recurrent, mild: Secondary | ICD-10-CM

## 2020-03-26 ENCOUNTER — Ambulatory Visit (INDEPENDENT_AMBULATORY_CARE_PROVIDER_SITE_OTHER): Payer: Federal, State, Local not specified - PPO | Admitting: Psychology

## 2020-03-26 DIAGNOSIS — F909 Attention-deficit hyperactivity disorder, unspecified type: Secondary | ICD-10-CM

## 2020-04-15 ENCOUNTER — Ambulatory Visit (INDEPENDENT_AMBULATORY_CARE_PROVIDER_SITE_OTHER): Payer: Federal, State, Local not specified - PPO | Admitting: Psychology

## 2020-04-15 DIAGNOSIS — F330 Major depressive disorder, recurrent, mild: Secondary | ICD-10-CM

## 2020-04-23 ENCOUNTER — Telehealth (INDEPENDENT_AMBULATORY_CARE_PROVIDER_SITE_OTHER): Payer: Federal, State, Local not specified - PPO | Admitting: Physician Assistant

## 2020-04-23 ENCOUNTER — Ambulatory Visit (INDEPENDENT_AMBULATORY_CARE_PROVIDER_SITE_OTHER): Payer: Federal, State, Local not specified - PPO | Admitting: Psychology

## 2020-04-23 ENCOUNTER — Encounter: Payer: Self-pay | Admitting: Physician Assistant

## 2020-04-23 VITALS — Temp 97.4°F | Ht 67.0 in | Wt 235.0 lb

## 2020-04-23 DIAGNOSIS — F330 Major depressive disorder, recurrent, mild: Secondary | ICD-10-CM | POA: Diagnosis not present

## 2020-04-23 DIAGNOSIS — J019 Acute sinusitis, unspecified: Secondary | ICD-10-CM | POA: Diagnosis not present

## 2020-04-23 MED ORDER — AZITHROMYCIN 250 MG PO TABS: ORAL_TABLET | ORAL | 0 refills | Status: DC

## 2020-04-23 NOTE — Progress Notes (Signed)
Patient ID: Rachel [name], female   DOB: 1970-10-23, 50 y.o.   MRN: [id number] .Marland KitchenVirtual Visit via Telephone Note  I connected with Rachel [name] on 04/23/20 at  7:10 AM EST by telephone and verified that I am speaking with the correct person using two identifiers.  Location: Patient: home Provider: clinic  .Marland KitchenParticipating in visit:  Patient: Rachel [name] Provider: Tandy Gaw PA-C   I discussed the limitations, risks, security and privacy concerns of performing an evaluation and management service by telephone and the availability of in person appointments. I also discussed with the patient that there may be a patient responsible charge related to this service. The patient expressed understanding and agreed to proceed.   History of Present Illness: Patient is a 50 year old female who presents to the clinic with 12 days of upper respiratory infection symptoms.  She actually got her first URI back in November and battled it for about 2 weeks before she was given amoxicillin.  She did get better but then her and her husband both started having more cold-like symptoms.  Her husband was tested for COVID and negative.  They are both vaccinated but have not gotten their booster.  She has been treating with over-the-counter cold and sinus remedies.  As well as nasal saline.  She continues to have sinus pressure and blowing out green discharge.  She denies any fever, chills, shortness of breath, significant cough, diarrhea, loss of smell or taste.  She does not feel like she can get better.  Her husband was given a Z-Pak and he is getting better.   .. Active Ambulatory Problems    Diagnosis Date Noted  . Morton's neuroma of left foot 03/10/2015  . Metatarsal deformity 03/10/2015  . Plantar fasciitis, bilateral 03/10/2015  . Pronation deformity of both feet 04/10/2015  . Class 2 obesity due to excess calories without serious comorbidity with body mass index (BMI) of 36.0 to 36.9 in adult 08/19/2017  .  Allergic rhinitis 12/17/2010  . Premenstrual syndrome 12/17/2010  . Tonsil stone 12/17/2010  . Mass of right eye 01/13/2018  . Blepharitis of right upper eyelid 01/13/2018  . Accessory skin tags 01/13/2018  . Menopausal symptoms 05/05/2018  . Abnormal weight gain 06/06/2019  . Perimenopausal 06/06/2019  . Primary osteoarthritis of both knees 09/05/2019   Resolved Ambulatory Problems    Diagnosis Date Noted  . No Resolved Ambulatory Problems   Past Medical History:  Diagnosis Date  . Allergy   . Obesity    Reviewed med, allergy, problem list.     Observations/Objective: No acute distress Normal mood. No labored breathing or cough. Sounded congested.   .. Today's Vitals   04/23/20 0705 04/23/20 0716  Temp:  (!) 97.4 F (36.3 C)  TempSrc:  Oral  Weight: 235 lb (106.6 kg)   Height: 5\' 7"  (1.702 m)    Body mass index is 36.81 kg/m.    Assessment and Plan: Marland KitchenAleece was seen today for sore throat.  Diagnoses and all orders for this visit:  Acute sinusitis, recurrence not specified, unspecified location -     azithromycin (ZITHROMAX Z-PAK) 250 MG tablet; Take 2 tablets (500 mg) on  Day 1,  followed by 1 tablet (250 mg) once daily on Days 2 through 5.   12 days of URI sinusitis symptoms. Sent zpak. Consider flonase. Seems she has been getting viruses back to back discussed immune support with ..Vitamin D3 5000 IU (125 mcg) daily Vitamin C 500 mg twice daily Zinc 50 to 75  mg daily Get good rest and good nutrition. Follow up as needed.    Follow Up Instructions:    I discussed the assessment and treatment plan with the patient. The patient was provided an opportunity to ask questions and all were answered. The patient agreed with the plan and demonstrated an understanding of the instructions.   The patient was advised to call back or seek an in-person evaluation if the symptoms worsen or if the condition fails to improve as anticipated.  I provided 10 minutes of  non-face-to-face time during this encounter.   Tandy Gaw, PA-C

## 2020-05-01 ENCOUNTER — Ambulatory Visit (INDEPENDENT_AMBULATORY_CARE_PROVIDER_SITE_OTHER): Payer: Federal, State, Local not specified - PPO | Admitting: Psychology

## 2020-05-01 DIAGNOSIS — F330 Major depressive disorder, recurrent, mild: Secondary | ICD-10-CM | POA: Diagnosis not present

## 2020-05-06 ENCOUNTER — Ambulatory Visit (INDEPENDENT_AMBULATORY_CARE_PROVIDER_SITE_OTHER): Payer: Federal, State, Local not specified - PPO | Admitting: Psychology

## 2020-05-06 DIAGNOSIS — F330 Major depressive disorder, recurrent, mild: Secondary | ICD-10-CM

## 2020-05-07 ENCOUNTER — Ambulatory Visit: Payer: Federal, State, Local not specified - PPO | Admitting: Psychology

## 2020-05-13 ENCOUNTER — Ambulatory Visit: Payer: Federal, State, Local not specified - PPO | Admitting: Psychology

## 2020-05-20 ENCOUNTER — Ambulatory Visit: Payer: Federal, State, Local not specified - PPO | Admitting: Psychology

## 2020-05-29 ENCOUNTER — Ambulatory Visit: Payer: Federal, State, Local not specified - PPO | Admitting: Psychology

## 2020-06-04 ENCOUNTER — Ambulatory Visit (INDEPENDENT_AMBULATORY_CARE_PROVIDER_SITE_OTHER): Payer: Federal, State, Local not specified - PPO | Admitting: Psychology

## 2020-06-04 DIAGNOSIS — F330 Major depressive disorder, recurrent, mild: Secondary | ICD-10-CM

## 2020-06-09 ENCOUNTER — Telehealth: Payer: Self-pay | Admitting: *Deleted

## 2020-06-09 NOTE — Telephone Encounter (Signed)
Returned call from 06/06/2020 at 1:45 PM, office closed at 12:00 PM. Left patient a message to call and schedule appointment.

## 2020-06-10 ENCOUNTER — Ambulatory Visit: Payer: Federal, State, Local not specified - PPO | Admitting: Physician Assistant

## 2020-06-10 ENCOUNTER — Encounter: Payer: Self-pay | Admitting: Physician Assistant

## 2020-06-10 ENCOUNTER — Other Ambulatory Visit: Payer: Self-pay

## 2020-06-10 VITALS — BP 126/64 | HR 68 | Temp 98.1°F | Ht 67.0 in | Wt 243.0 lb

## 2020-06-10 DIAGNOSIS — H6983 Other specified disorders of Eustachian tube, bilateral: Secondary | ICD-10-CM | POA: Diagnosis not present

## 2020-06-10 DIAGNOSIS — H9193 Unspecified hearing loss, bilateral: Secondary | ICD-10-CM | POA: Insufficient documentation

## 2020-06-10 DIAGNOSIS — J302 Other seasonal allergic rhinitis: Secondary | ICD-10-CM | POA: Diagnosis not present

## 2020-06-10 MED ORDER — METHYLPREDNISOLONE ACETATE 80 MG/ML IJ SUSP
80.0000 mg | Freq: Once | INTRAMUSCULAR | Status: AC
  Administered 2020-06-10: 80 mg via INTRAMUSCULAR

## 2020-06-10 NOTE — Progress Notes (Signed)
Subjective:    Patient ID: Rachel [name], female    DOB: [birth year], 50 y.o.   MRN: [id number]  HPI  Patient is a 50 year old female with allergic rhinitis, seasonal allergies who presents to the clinic with 1 weeks of worsening allergies and bilateral ear pressure, itchiness, hearing loss.  She denies any fever, chills, sinus pressure, sore throat, cough, body aches, shortness of breath, wheezing.  She has been taking Allegra and switch to Claritin.  Claritin seems to help a lot more.  She is also started using Flonase.  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  .. Active Ambulatory Problems    Diagnosis Date Noted  . Morton's neuroma of left foot 03/10/2015  . Metatarsal deformity 03/10/2015  . Plantar fasciitis, bilateral 03/10/2015  . Pronation deformity of both feet 04/10/2015  . Class 2 obesity due to excess calories without serious comorbidity with body mass index (BMI) of 36.0 to 36.9 in adult 08/19/2017  . Allergic rhinitis 12/17/2010  . Premenstrual syndrome 12/17/2010  . Tonsil stone 12/17/2010  . Mass of right eye 01/13/2018  . Blepharitis of right upper eyelid 01/13/2018  . Accessory skin tags 01/13/2018  . Menopausal symptoms 05/05/2018  . Abnormal weight gain 06/06/2019  . Perimenopausal 06/06/2019  . Primary osteoarthritis of both knees 09/05/2019  . Seasonal allergies 06/10/2020  . Eustachian tube dysfunction, bilateral 06/10/2020  . Bilateral hearing loss 06/10/2020   Resolved Ambulatory Problems    Diagnosis Date Noted  . No Resolved Ambulatory Problems   Past Medical History:  Diagnosis Date  . Allergy   . Obesity     Review of Systems See HPI.     Objective:   Physical Exam Vitals reviewed.  Constitutional:      Appearance: Normal appearance. She is obese.  HENT:     Head:     Comments: No sinus tenderness to palpation.     Right Ear: There is no impacted cerumen.     Left Ear:  There is no impacted cerumen.     Ears:     Comments: Bilateral TMs with some fluid behind both ears. No signs of infections.     Nose: Nose normal.     Mouth/Throat:     Mouth: Mucous membranes are moist.     Pharynx: No oropharyngeal exudate or posterior oropharyngeal erythema.     Comments: Clear sinus drainage on oropharynx.  Eyes:     Extraocular Movements: Extraocular movements intact.     Conjunctiva/sclera: Conjunctivae normal.     Pupils: Pupils are equal, round, and reactive to light.  Cardiovascular:     Rate and Rhythm: Normal rate and regular rhythm.     Pulses: Normal pulses.     Heart sounds: Normal heart sounds.  Pulmonary:     Effort: Pulmonary effort is normal.     Breath sounds: Normal breath sounds.  Neurological:     General: No focal deficit present.     Mental Status: She is alert and oriented to person, place, and time.  Psychiatric:        Mood and Affect: Mood normal.        Behavior: Behavior normal.       ..  Hearing Screening   125Hz  250Hz  500Hz  1000Hz  2000Hz  3000Hz  4000Hz  6000Hz  8000Hz   Right ear:    25 25  25     Left ear:    20 20  20  Assessment & Plan:  Marland KitchenMarland KitchenSelen was seen today for ear pain.  Diagnoses and all orders for this visit:  Eustachian tube dysfunction, bilateral -     methylPREDNISolone acetate (DEPO-MEDROL) injection 80 mg  Seasonal allergies  Bilateral hearing loss, unspecified hearing loss type   No signs of infection. Seems like allergies.  Hearting is normal except no hearing at really low frequency 500hz .  Continue claritin and flonase.  Added depo medrol IM 80mg  injection. If not improving may need to add decongestant to the allergy mix.  Follow up as needed or if symptoms worsen.

## 2020-06-10 NOTE — Patient Instructions (Signed)

## 2020-06-17 ENCOUNTER — Ambulatory Visit: Payer: Self-pay | Admitting: Psychology

## 2020-06-23 ENCOUNTER — Ambulatory Visit: Payer: Federal, State, Local not specified - PPO | Admitting: Obstetrics & Gynecology

## 2020-06-26 ENCOUNTER — Ambulatory Visit (INDEPENDENT_AMBULATORY_CARE_PROVIDER_SITE_OTHER): Payer: Federal, State, Local not specified - PPO | Admitting: Psychology

## 2020-06-26 DIAGNOSIS — F330 Major depressive disorder, recurrent, mild: Secondary | ICD-10-CM

## 2020-07-01 ENCOUNTER — Ambulatory Visit (INDEPENDENT_AMBULATORY_CARE_PROVIDER_SITE_OTHER): Payer: Federal, State, Local not specified - PPO | Admitting: Psychology

## 2020-07-01 DIAGNOSIS — F339 Major depressive disorder, recurrent, unspecified: Secondary | ICD-10-CM | POA: Diagnosis not present

## 2020-07-03 ENCOUNTER — Other Ambulatory Visit (HOSPITAL_COMMUNITY)
Admission: RE | Admit: 2020-07-03 | Discharge: 2020-07-03 | Disposition: A | Discharge status: Home / Self Care | Payer: Federal, State, Local not specified - PPO | Source: Ambulatory Visit | Attending: Obstetrics and Gynecology | Admitting: Obstetrics and Gynecology

## 2020-07-03 ENCOUNTER — Other Ambulatory Visit: Payer: Self-pay

## 2020-07-03 ENCOUNTER — Ambulatory Visit (INDEPENDENT_AMBULATORY_CARE_PROVIDER_SITE_OTHER): Payer: Federal, State, Local not specified - PPO | Admitting: Obstetrics and Gynecology

## 2020-07-03 ENCOUNTER — Encounter: Payer: Self-pay | Admitting: Obstetrics and Gynecology

## 2020-07-03 VITALS — BP 121/80 | HR 68 | Resp 16 | Ht 67.0 in | Wt 242.0 lb

## 2020-07-03 DIAGNOSIS — Z01419 Encounter for gynecological examination (general) (routine) without abnormal findings: Secondary | ICD-10-CM

## 2020-07-03 DIAGNOSIS — Z1231 Encounter for screening mammogram for malignant neoplasm of breast: Secondary | ICD-10-CM

## 2020-07-03 DIAGNOSIS — N950 Postmenopausal bleeding: Secondary | ICD-10-CM | POA: Diagnosis not present

## 2020-07-03 DIAGNOSIS — N898 Other specified noninflammatory disorders of vagina: Secondary | ICD-10-CM | POA: Diagnosis not present

## 2020-07-03 DIAGNOSIS — N951 Menopausal and female climacteric states: Secondary | ICD-10-CM

## 2020-07-03 DIAGNOSIS — R4586 Emotional lability: Secondary | ICD-10-CM

## 2020-07-03 DIAGNOSIS — Z139 Encounter for screening, unspecified: Secondary | ICD-10-CM

## 2020-07-03 DIAGNOSIS — Z124 Encounter for screening for malignant neoplasm of cervix: Secondary | ICD-10-CM

## 2020-07-03 NOTE — Progress Notes (Signed)
GYNECOLOGY ANNUAL PREVENTATIVE CARE ENCOUNTER NOTE  Subjective:   Rachel [name] is a 50 y.o. G77P2002 female here for a annual gynecologic exam. Current complaints: significant mood swings, hot flashes, vaginal dryness. Been having these symptoms throughout the past year and she is taking macca root which seems to be helping some. She is most concerned about mood symptoms. Also started new exercise regimen which she thinks is helping her mood symptoms as well.   Last period 04/2019 and did not have a period for over a year, then had an episode of spotting in 05/2020. Did not last long.     Denies abnormal discharge, pelvic pain, problems with intercourse or other gynecologic concerns. Declines STI screen.   Gynecologic History No LMP recorded. (Menstrual status: Perimenopausal). Contraception: vasectomy Last Pap: 08/2017. Results: normal Last mammogram: 08/2019. Results: [facility] 1 DEXA: has never had  Obstetric History OB History  Gravida Para Term Preterm AB Living  2 2 2     2   SAB IAB Ectopic Multiple Live Births               # Outcome Date GA Lbr Len/2nd Weight Sex Delivery Anes PTL Lv  2 Term      Vag-Spont     1 Term      Vag-Spont       Past Medical History:  Diagnosis Date  . Allergy   . Obesity     Past Surgical History:  Procedure Laterality Date  . TONSILLECTOMY    . WISDOM TOOTH EXTRACTION      Current Outpatient Medications on File Prior to Visit  Medication Sig Dispense Refill  . albuterol (VENTOLIN HFA) 108 (90 Base) MCG/ACT inhaler Inhale 1-2 puffs into the lungs every 6 (six) hours as needed for wheezing or shortness of breath. 8 g 0  . diclofenac Sodium (VOLTAREN) 1 % GEL Apply 4 g topically 4 (four) times daily. To affected joint. 100 g 5  . MACA ROOT PO Take by mouth.    . Multiple Vitamin (MULTIVITAMIN) tablet Take 1 tablet by mouth daily.     No current facility-administered medications on file prior to visit.    No Known Allergies  Social  History   Socioeconomic History  . Marital status: Married    Spouse name: Not on file  . Number of children: Not on file  . Years of education: Not on file  . Highest education level: Not on file  Occupational History  . Occupation: spanish interpreter  Tobacco Use  . Smoking status: Former Smoker    Quit date: 09/01/1995    Years since quitting: 24.8  . Smokeless tobacco: Never Used  Substance and Sexual Activity  . Alcohol use: Yes    Alcohol/week: 0.0 standard drinks    Comment: rare  . Drug use: No  . Sexual activity: Yes    Partners: Male    Birth control/protection: Other-see comments    Comment: spouse had vasectomy  Other Topics Concern  . Not on file  Social History Narrative  . Not on file   Social Determinants of Health   Financial Resource Strain: Not on file  Food Insecurity: Not on file  Transportation Needs: Not on file  Physical Activity: Not on file  Stress: Not on file  Social Connections: Not on file  Intimate Partner Violence: Not on file    Family History  Problem Relation Age of Onset  . Healthy Mother   . Hypertension Father   .  Heart attack Maternal Grandfather   . Heart attack Maternal Uncle   . Diabetes Maternal Aunt     The following portions of the patient's history were reviewed and updated as appropriate: allergies, current medications, past family history, past medical history, past social history, past surgical history and problem list.  Review of Systems Pertinent items are noted in HPI.   Objective:  BP 121/80   Pulse 68   Resp 16   Ht 5\' 7"  (1.702 m)   Wt 242 lb (109.8 kg)   BMI 37.90 kg/m  CONSTITUTIONAL: Well-developed, well-nourished female in no acute distress.  HENT:  Normocephalic, atraumatic, External right and left ear normal. Oropharynx is clear and moist EYES: Conjunctivae and EOM are normal. Pupils are equal, round, and reactive to light. No scleral icterus.  NECK: Normal range of motion, supple, no masses.   Normal thyroid.  SKIN: Skin is warm and dry. No rash noted. Not diaphoretic. No erythema. No pallor. NEUROLOGIC: Alert and oriented to person, place, and time. Normal reflexes, muscle tone coordination. No cranial nerve deficit noted. PSYCHIATRIC: Normal mood and affect. Normal behavior. Normal judgment and thought content. CARDIOVASCULAR: Normal heart rate noted RESPIRATORY: Effort normal, no problems with respiration noted. BREASTS: Symmetric in size. No masses, skin changes, nipple drainage, or lymphadenopathy. ABDOMEN: Soft, no distention noted.  No tenderness, rebound or guarding.  PELVIC: Normal appearing external genitalia; normal appearing vaginal mucosa and cervix.  No abnormal discharge noted.  Pap smear obtained.Normal uterine size, no other palpable masses, no uterine or adnexal tenderness. MUSCULOSKELETAL: Normal range of motion. No tenderness.  No cyanosis, clubbing, or edema.  2+ distal pulses.  Exam done with chaperone present.  Assessment and Plan:    1. Screening due - MS Digital Screening; Future  2. Well woman exam Healthy female exam - Cytology - PAP( [facility]) - Ambulatory referral to Gastroenterology  3. Post-menopausal bleeding - check - return for EMB if > stripe - US PELVIC COMPLETE WITH TRANSVAGINAL; Future  4. Vaginal dryness - try OTC lubricants  5. Mood changes - try remifemin - lots of options - reviewed escalating trial (SSRI, HRT) if no improvement  6. Hot flushes, perimenopausal - mildly improved with macca  7. Cervical cancer screening Pap today  8. Encounter for screening mammogram for malignant neoplasm of breast   Will follow up results of pap smear and manage accordingly. Encouraged improvement in diet and exercise.  COVID vaccine UTD Declines STI screen. Mammogram ordered/UTD Referral for colonoscopy today DEXA not due based on age  Routine preventative health maintenance measures emphasized. Please refer to After  Visit Summary for other counseling recommendations.     [place], MD, [facility] Attending Center for [facility])

## 2020-07-03 NOTE — Patient Instructions (Addendum)
Look for Remifemin at your local pharmacy. This is a non-prescription herbal remedy called black cohosh that may improve your menopausal symptoms. Please read the inserts as black cohosh can interfere with other medications and medical conditions. Please call if you have any questions before taking. This may take several weeks/months to show an improvement. If this herbal remedy does not improve your symptoms, please let me know, and we will discuss other options.    Lubrication . Used for intercourse to reduce friction . Avoid ones that have glycerin, warming gels, tingling gels, icing or cooling gel, scented . Avoid parabens due to a preservative similar to female sex hormone . May need to be reapplied once or several times during sexual activity . Can be applied to both partners genitals prior to vaginal penetration to minimize friction or irritation . Prevent irritation and mucosal tears that cause post coital pain and increased the risk of vaginal and urinary tract infections . Oil-based lubricants cannot be used with condoms due to breaking them down.  Least likely to irritate vaginal tissue.  . Plant based-lubes are safe . Silicone-based lubrication are thicker and last long and used for post-menopausal women  Vaginal Lubricators Here is a list of some suggested lubricators you can use for intercourse. Use the most hypoallergenic product.  You can place on you or your partner.   Slippery Stuff ( water based)  Sylk or Sliquid Natural H2O ( good  if frequent UTI's)- [employer], amazon  Sliquid organics silk-(aloe and silicone based )  [employer] (www.blossom-organics.com)- (aloe based )  Coconut oil, olive oil -not good with condoms   PJur Woman Nude- (water based) amazon  Uberlube- ( silicon) Amazon  Aloe Vera- Sprouts has an organic one  Yes lubricant- (water based and has plant oil based similar to silicone) [employer] Platinum-Silicone, [employer], [facility]  Olive and  Bee intimate cream-  www.oliveandbee.com.au  Pink - [employer] stuff  Erosense Sync- [employer], Sim Boast Things to avoid in lubricants are glycerin, warming gels, tingling gels, icing or cooling  gels, and scented gels.  Also avoid Vaseline. KY jelly, Replens, and Astroglide kills good bacteria(lactobacilli)  Things to avoid in the vaginal area . Do not use things to irritate the vulvar area . No lotions- see below . No soaps; can use Aveeno or Calendula cleanser if needed. Must be gentle . No deodorants . No douches . Good to sleep without underwear to let the vaginal area to air out . No scrubbing: spread the lips to let warm water rinse over labias and pat dry  Creams that can be used on the Vulva Area  V [employer], [employer]  Vital V [place] Salve  Julva- [employer] Botanical Pro-Meno Wild Yam Cream  Coconut oil, olive oil  Cleo by [employer] labial moisturizer -Amazon,   Desert [place] Releveum ( lidocaine) or Desert [employer]

## 2020-07-07 LAB — CYTOLOGY - PAP
Comment: NEGATIVE
Diagnosis: NEGATIVE
High risk HPV: NEGATIVE

## 2020-07-14 ENCOUNTER — Ambulatory Visit (INDEPENDENT_AMBULATORY_CARE_PROVIDER_SITE_OTHER): Payer: Federal, State, Local not specified - PPO

## 2020-07-14 ENCOUNTER — Other Ambulatory Visit: Payer: Self-pay

## 2020-07-14 DIAGNOSIS — N939 Abnormal uterine and vaginal bleeding, unspecified: Secondary | ICD-10-CM | POA: Diagnosis not present

## 2020-07-14 DIAGNOSIS — N950 Postmenopausal bleeding: Secondary | ICD-10-CM

## 2020-07-14 DIAGNOSIS — N898 Other specified noninflammatory disorders of vagina: Secondary | ICD-10-CM | POA: Diagnosis not present

## 2020-07-14 DIAGNOSIS — N888 Other specified noninflammatory disorders of cervix uteri: Secondary | ICD-10-CM | POA: Diagnosis not present

## 2020-07-15 ENCOUNTER — Ambulatory Visit (INDEPENDENT_AMBULATORY_CARE_PROVIDER_SITE_OTHER): Payer: Federal, State, Local not specified - PPO | Admitting: Psychology

## 2020-07-15 DIAGNOSIS — F330 Major depressive disorder, recurrent, mild: Secondary | ICD-10-CM

## 2020-07-16 ENCOUNTER — Ambulatory Visit: Payer: Federal, State, Local not specified - PPO | Admitting: Psychology

## 2020-07-28 DIAGNOSIS — F411 Generalized anxiety disorder: Secondary | ICD-10-CM | POA: Diagnosis not present

## 2020-07-28 DIAGNOSIS — Z1339 Encounter for screening examination for other mental health and behavioral disorders: Secondary | ICD-10-CM | POA: Diagnosis not present

## 2020-07-28 DIAGNOSIS — R41840 Attention and concentration deficit: Secondary | ICD-10-CM | POA: Diagnosis not present

## 2020-07-31 DIAGNOSIS — R41840 Attention and concentration deficit: Secondary | ICD-10-CM | POA: Diagnosis not present

## 2020-08-04 DIAGNOSIS — F411 Generalized anxiety disorder: Secondary | ICD-10-CM | POA: Diagnosis not present

## 2020-08-04 DIAGNOSIS — F902 Attention-deficit hyperactivity disorder, combined type: Secondary | ICD-10-CM | POA: Diagnosis not present

## 2020-08-05 ENCOUNTER — Ambulatory Visit (INDEPENDENT_AMBULATORY_CARE_PROVIDER_SITE_OTHER): Payer: Federal, State, Local not specified - PPO | Admitting: Psychology

## 2020-08-05 DIAGNOSIS — F330 Major depressive disorder, recurrent, mild: Secondary | ICD-10-CM | POA: Diagnosis not present

## 2020-08-10 ENCOUNTER — Telehealth: Payer: Federal, State, Local not specified - PPO | Admitting: Nurse Practitioner

## 2020-08-10 DIAGNOSIS — B9689 Other specified bacterial agents as the cause of diseases classified elsewhere: Secondary | ICD-10-CM | POA: Diagnosis not present

## 2020-08-10 DIAGNOSIS — J329 Chronic sinusitis, unspecified: Secondary | ICD-10-CM | POA: Diagnosis not present

## 2020-08-10 MED ORDER — AZITHROMYCIN 250 MG PO TABS: ORAL_TABLET | ORAL | 0 refills | Status: AC

## 2020-08-10 MED ORDER — AMOXICILLIN-POT CLAVULANATE 875-125 MG PO TABS: 1.0000 | ORAL_TABLET | Freq: Two times a day (BID) | ORAL | 0 refills | Status: DC

## 2020-08-10 NOTE — Addendum Note (Signed)
Addended by: Jackquline Berlin on: 08/10/2020 09:30 AM   Modules accepted: Orders

## 2020-08-10 NOTE — Progress Notes (Signed)
We are sorry that you are not feeling well.  Here is how we plan to help!  Based on what you have shared with me it looks like you have sinusitis.  Sinusitis is inflammation and infection in the sinus cavities of the head.  Based on your presentation I believe you most likely have Acute Bacterial Sinusitis.  This is an infection caused by bacteria and is treated with antibiotics. I have prescribed Augmentin 875mg/125mg one tablet twice daily with food, for 7 days. You may use an oral decongestant such as Mucinex D or if you have glaucoma or high blood pressure use plain Mucinex. Saline nasal spray help and can safely be used as often as needed for congestion.  If you develop worsening sinus pain, fever or notice severe headache and vision changes, or if symptoms are not better after completion of antibiotic, please schedule an appointment with a health care provider.    Sinus infections are not as easily transmitted as other respiratory infection, however we still recommend that you avoid close contact with loved ones, especially the very young and elderly.  Remember to wash your hands thoroughly throughout the day as this is the number one way to prevent the spread of infection!  Home Care:  Only take medications as instructed by your medical team.  Complete the entire course of an antibiotic.  Do not take these medications with alcohol.  A steam or ultrasonic humidifier can help congestion.  You can place a towel over your head and breathe in the steam from hot water coming from a faucet.  Avoid close contacts especially the very young and the elderly.  Cover your mouth when you cough or sneeze.  Always remember to wash your hands.  Get Help Right Away If:  You develop worsening fever or sinus pain.  You develop a severe head ache or visual changes.  Your symptoms persist after you have completed your treatment plan.  Make sure you  Understand these instructions.  Will watch your  condition.  Will get help right away if you are not doing well or get worse.  Your e-visit answers were reviewed by a board certified advanced clinical practitioner to complete your personal care plan.  Depending on the condition, your plan could have included both over the counter or prescription medications.  If there is a problem please reply  once you have received a response from your provider.  Your safety is important to us.  If you have drug allergies check your prescription carefully.    You can use MyChart to ask questions about today's visit, request a non-urgent call back, or ask for a work or school excuse for 24 hours related to this e-Visit. If it has been greater than 24 hours you will need to follow up with your provider, or enter a new e-Visit to address those concerns.  You will get an e-mail in the next two days asking about your experience.  I hope that your e-visit has been valuable and will speed your recovery. Thank you for using e-visits.  I have spent at least 5 minutes reviewing and documenting in the patient's chart.   

## 2020-08-28 ENCOUNTER — Ambulatory Visit: Payer: Federal, State, Local not specified - PPO

## 2020-09-10 ENCOUNTER — Ambulatory Visit (INDEPENDENT_AMBULATORY_CARE_PROVIDER_SITE_OTHER): Payer: Federal, State, Local not specified - PPO

## 2020-09-10 ENCOUNTER — Other Ambulatory Visit: Payer: Self-pay

## 2020-09-10 DIAGNOSIS — Z1231 Encounter for screening mammogram for malignant neoplasm of breast: Secondary | ICD-10-CM

## 2020-09-10 DIAGNOSIS — Z139 Encounter for screening, unspecified: Secondary | ICD-10-CM

## 2020-09-18 DIAGNOSIS — Z20822 Contact with and (suspected) exposure to covid-19: Secondary | ICD-10-CM | POA: Diagnosis not present

## 2020-09-25 ENCOUNTER — Ambulatory Visit: Payer: Federal, State, Local not specified - PPO | Admitting: Obstetrics and Gynecology

## 2020-09-25 ENCOUNTER — Encounter: Payer: Self-pay | Admitting: Obstetrics and Gynecology

## 2020-09-25 ENCOUNTER — Other Ambulatory Visit (HOSPITAL_COMMUNITY)
Admission: RE | Admit: 2020-09-25 | Discharge: 2020-09-25 | Disposition: A | Discharge status: Home / Self Care | Payer: Federal, State, Local not specified - PPO | Source: Ambulatory Visit | Attending: Obstetrics and Gynecology | Admitting: Obstetrics and Gynecology

## 2020-09-25 ENCOUNTER — Other Ambulatory Visit: Payer: Self-pay

## 2020-09-25 VITALS — BP 125/66 | HR 71 | Resp 16 | Ht 67.0 in | Wt 243.0 lb

## 2020-09-25 DIAGNOSIS — N951 Menopausal and female climacteric states: Secondary | ICD-10-CM

## 2020-09-25 DIAGNOSIS — R102 Pelvic and perineal pain: Secondary | ICD-10-CM

## 2020-09-25 DIAGNOSIS — N898 Other specified noninflammatory disorders of vagina: Secondary | ICD-10-CM

## 2020-09-25 DIAGNOSIS — N950 Postmenopausal bleeding: Secondary | ICD-10-CM | POA: Diagnosis not present

## 2020-09-25 NOTE — Progress Notes (Signed)
GYNECOLOGY OFFICE FOLLOW UP NOTE  History:  50 y.o. [id number] here today for follow up for multiple issues. Overall, doing well. Has not had any further post menopausal bleeding. Hot flashes are stable/manageable on macca root. Mood swings are stable/manageable on her supplements and with exercise routine. Does also have some new vaginal odor, unsure if it is infection or not. Vaginal dryness has improved with OTC cream.  Does have some right sided pelvic tenderness, worse with deep palpation. When not touching it, it is not painful but she can "tell its there." Has had some constipation recently as well. Started on metamucil for this.   Past Medical History:  Diagnosis Date   Allergy    Obesity     Past Surgical History:  Procedure Laterality Date   TONSILLECTOMY     WISDOM TOOTH EXTRACTION       Current Outpatient Medications:    albuterol (VENTOLIN HFA) 108 (90 Base) MCG/ACT inhaler, Inhale 1-2 puffs into the lungs every 6 (six) hours as needed for wheezing or shortness of breath., Disp: 8 g, Rfl: 0   diclofenac Sodium (VOLTAREN) 1 % GEL, Apply 4 g topically 4 (four) times daily. To affected joint., Disp: 100 g, Rfl: 5   MACA ROOT PO, Take by mouth., Disp: , Rfl:    Multiple Vitamin (MULTIVITAMIN) tablet, Take 1 tablet by mouth daily., Disp: , Rfl:   The following portions of the patient's history were reviewed and updated as appropriate: allergies, current medications, past family history, past medical history, past social history, past surgical history and problem list.   Review of Systems:  Pertinent items noted in HPI and remainder of comprehensive ROS otherwise negative.   Objective:  Physical Exam BP 125/66   Pulse 71   Resp 16   Ht 5\' 7"  (1.702 m)   Wt 243 lb (110.2 kg)   BMI 38.06 kg/m  CONSTITUTIONAL: Well-developed, well-nourished female in no acute distress.  HENT:  Normocephalic, atraumatic. External right and left ear normal. Oropharynx is clear and  moist EYES: Conjunctivae and EOM are normal. Pupils are equal, round, and reactive to light. No scleral icterus.  NECK: Normal range of motion, supple, no masses SKIN: Skin is warm and dry. No rash noted. Not diaphoretic. No erythema. No pallor. NEUROLOGIC: Alert and oriented to person, place, and time. Normal reflexes, muscle tone coordination. No cranial nerve deficit noted. PSYCHIATRIC: Normal mood and affect. Normal behavior. Normal judgment and thought content. CARDIOVASCULAR: Normal heart rate noted RESPIRATORY: Effort normal, no problems with respiration noted ABDOMEN: Soft, no distention noted.   PELVIC: Normal appearing external genitalia; normal appearing vaginal mucosa and cervix.  Scant white discharge noted.  Pelvic cultures obtained. MUSCULOSKELETAL: Normal range of motion. No edema noted.  Exam done with chaperone present.  Labs and Imaging MM 3D SCREEN BREAST BILATERAL  Result Date: 09/12/2020 CLINICAL DATA:  Screening. EXAM: DIGITAL SCREENING BILATERAL MAMMOGRAM WITH TOMOSYNTHESIS AND CAD TECHNIQUE: Bilateral screening digital craniocaudal and mediolateral oblique mammograms were obtained. Bilateral screening digital breast tomosynthesis was performed. The images were evaluated with computer-aided detection. COMPARISON:  Previous exam(s). ACR Breast Density Category b: There are scattered areas of fibroglandular density. FINDINGS: There are no findings suspicious for malignancy. The images were evaluated with computer-aided detection. IMPRESSION: No mammographic evidence of malignancy. A result letter of this screening mammogram will be mailed directly to the patient. RECOMMENDATION: Screening mammogram in one year. (Code:SM-B-01Y) BI-RADS CATEGORY  1: Negative. Electronically Signed   By: 11/12/2020.D.  On: 09/12/2020 11:18    Assessment & Plan:  1. Post-menopausal bleeding Reviewed [place], no indication for EMB as stripe is 3 mm, unless she has further bleedign  2. Vaginal  dryness Cont OTC lubricants  3. Hot flushes, perimenopausal Cont supplements   4. Vaginal odor - Swab today - if no infection, may just be new odor with hypoestrogenized state, try probiotics - Cervicovaginal ancillary only  5. Pelvic pain - Cont metamucil to see if improving constipation improves pain - no ovarian cyst seen on [place] - return if no improvement once constipation improved   Routine preventative health maintenance measures emphasized. Please refer to After Visit Summary for other counseling recommendations.   Return in about 6 months (around 03/27/2021), or if symptoms worsen or fail to improve.  Baldemar Lenis, MD, [facility] Attending Center for [employer] [facility])

## 2020-09-26 ENCOUNTER — Other Ambulatory Visit: Payer: Self-pay | Admitting: Family Medicine

## 2020-09-26 LAB — CERVICOVAGINAL ANCILLARY ONLY
Bacterial Vaginitis (gardnerella): NEGATIVE
Candida Glabrata: NEGATIVE
Candida Vaginitis: POSITIVE — AB
Comment: NEGATIVE
Comment: NEGATIVE
Comment: NEGATIVE

## 2020-09-26 MED ORDER — FLUCONAZOLE 150 MG PO TABS
150.0000 mg | ORAL_TABLET | Freq: Once | ORAL | 2 refills | Status: AC
Start: 2020-09-26 — End: 2020-09-26

## 2020-09-30 DIAGNOSIS — F902 Attention-deficit hyperactivity disorder, combined type: Secondary | ICD-10-CM | POA: Diagnosis not present

## 2020-09-30 DIAGNOSIS — F411 Generalized anxiety disorder: Secondary | ICD-10-CM | POA: Diagnosis not present

## 2020-10-03 DIAGNOSIS — H5319 Other subjective visual disturbances: Secondary | ICD-10-CM | POA: Diagnosis not present

## 2020-10-23 ENCOUNTER — Ambulatory Visit: Payer: Federal, State, Local not specified - PPO | Admitting: Family Medicine

## 2020-10-28 ENCOUNTER — Ambulatory Visit: Payer: Federal, State, Local not specified - PPO | Admitting: Medical-Surgical

## 2020-11-01 DIAGNOSIS — M9905 Segmental and somatic dysfunction of pelvic region: Secondary | ICD-10-CM | POA: Diagnosis not present

## 2020-11-01 DIAGNOSIS — M9901 Segmental and somatic dysfunction of cervical region: Secondary | ICD-10-CM | POA: Diagnosis not present

## 2020-11-01 DIAGNOSIS — M25551 Pain in right hip: Secondary | ICD-10-CM | POA: Diagnosis not present

## 2020-11-01 DIAGNOSIS — M542 Cervicalgia: Secondary | ICD-10-CM | POA: Diagnosis not present

## 2020-11-01 DIAGNOSIS — M461 Sacroiliitis, not elsewhere classified: Secondary | ICD-10-CM | POA: Diagnosis not present

## 2020-11-01 DIAGNOSIS — M9904 Segmental and somatic dysfunction of sacral region: Secondary | ICD-10-CM | POA: Diagnosis not present

## 2020-11-04 DIAGNOSIS — M9904 Segmental and somatic dysfunction of sacral region: Secondary | ICD-10-CM | POA: Diagnosis not present

## 2020-11-04 DIAGNOSIS — M461 Sacroiliitis, not elsewhere classified: Secondary | ICD-10-CM | POA: Diagnosis not present

## 2020-11-04 DIAGNOSIS — M9905 Segmental and somatic dysfunction of pelvic region: Secondary | ICD-10-CM | POA: Diagnosis not present

## 2020-11-04 DIAGNOSIS — M25551 Pain in right hip: Secondary | ICD-10-CM | POA: Diagnosis not present

## 2020-11-10 DIAGNOSIS — M9904 Segmental and somatic dysfunction of sacral region: Secondary | ICD-10-CM | POA: Diagnosis not present

## 2020-11-10 DIAGNOSIS — M461 Sacroiliitis, not elsewhere classified: Secondary | ICD-10-CM | POA: Diagnosis not present

## 2020-11-10 DIAGNOSIS — M9901 Segmental and somatic dysfunction of cervical region: Secondary | ICD-10-CM | POA: Diagnosis not present

## 2020-11-10 DIAGNOSIS — M542 Cervicalgia: Secondary | ICD-10-CM | POA: Diagnosis not present

## 2020-11-11 ENCOUNTER — Encounter: Payer: Self-pay | Admitting: Gastroenterology

## 2020-11-14 DIAGNOSIS — M9901 Segmental and somatic dysfunction of cervical region: Secondary | ICD-10-CM | POA: Diagnosis not present

## 2020-11-14 DIAGNOSIS — M9904 Segmental and somatic dysfunction of sacral region: Secondary | ICD-10-CM | POA: Diagnosis not present

## 2020-11-14 DIAGNOSIS — M461 Sacroiliitis, not elsewhere classified: Secondary | ICD-10-CM | POA: Diagnosis not present

## 2020-11-14 DIAGNOSIS — M542 Cervicalgia: Secondary | ICD-10-CM | POA: Diagnosis not present

## 2020-12-09 DIAGNOSIS — R0681 Apnea, not elsewhere classified: Secondary | ICD-10-CM | POA: Diagnosis not present

## 2020-12-24 DIAGNOSIS — F411 Generalized anxiety disorder: Secondary | ICD-10-CM | POA: Diagnosis not present

## 2020-12-24 DIAGNOSIS — F902 Attention-deficit hyperactivity disorder, combined type: Secondary | ICD-10-CM | POA: Diagnosis not present

## 2020-12-26 ENCOUNTER — Ambulatory Visit (AMBULATORY_SURGERY_CENTER): Payer: Federal, State, Local not specified - PPO

## 2020-12-26 ENCOUNTER — Encounter: Payer: Self-pay | Admitting: Gastroenterology

## 2020-12-26 ENCOUNTER — Other Ambulatory Visit: Payer: Self-pay

## 2020-12-26 VITALS — Ht 67.0 in | Wt 242.0 lb

## 2020-12-26 DIAGNOSIS — Z1211 Encounter for screening for malignant neoplasm of colon: Secondary | ICD-10-CM

## 2020-12-26 MED ORDER — PLENVU 140 G PO SOLR
1.0000 | ORAL | 0 refills | Status: DC
Start: 2020-12-26 — End: 2021-01-06

## 2020-12-26 NOTE — Progress Notes (Signed)
Pre visit completed via phone call;  Patient verified name, DOB, and address; No egg or soy allergy known to patient  No issues known to pt with past sedation with any surgeries or procedures Patient denies ever being told they had issues or difficulty with intubation  No FH of Malignant Hyperthermia Pt is not on diet pills Pt is not on  home 02  Pt is not on blood thinners  Pt denies issues with constipation  No A fib or A flutter  EMMI video via MyChart  COVID 19 guidelines implemented in PV today with Pt and RN  Pt is fully vaccinated for Covid x 2; Coupon given to pt in PV today, Code to Pharmacy and NO PA's for preps discussed with pt in PV today  Discussed with pt there will be an out-of-pocket cost for prep and that varies from $0 to 70 +  dollars  Due to the COVID-19 pandemic we are asking patients to follow certain guidelines.  Pt aware of COVID protocols and [facility] guidelines   

## 2021-01-05 DIAGNOSIS — G4733 Obstructive sleep apnea (adult) (pediatric): Secondary | ICD-10-CM | POA: Diagnosis not present

## 2021-01-06 ENCOUNTER — Other Ambulatory Visit: Payer: Self-pay

## 2021-01-06 ENCOUNTER — Encounter: Payer: Self-pay | Admitting: Gastroenterology

## 2021-01-06 ENCOUNTER — Ambulatory Visit (AMBULATORY_SURGERY_CENTER): Payer: Federal, State, Local not specified - PPO | Admitting: Gastroenterology

## 2021-01-06 VITALS — BP 115/66 | HR 52 | Temp 97.1°F | Resp 17 | Ht 67.0 in | Wt 242.0 lb

## 2021-01-06 DIAGNOSIS — Z1211 Encounter for screening for malignant neoplasm of colon: Secondary | ICD-10-CM

## 2021-01-06 MED ORDER — SODIUM CHLORIDE 0.9 % IV SOLN: 500.0000 mL | Freq: Once | INTRAVENOUS | Status: DC

## 2021-01-06 NOTE — Progress Notes (Signed)
A and O x3. Report to RN. Tolerated MAC anesthesia well. 

## 2021-01-06 NOTE — Progress Notes (Signed)
Pt's states no medical or surgical changes since previsit or office visit.   CHECK-IN-LINDA  V/S-DT 

## 2021-01-06 NOTE — Op Note (Signed)
[place] Patient Name: Rachel [name] Procedure Date: 01/06/2021 9:54 AM MRN: [id number] Endoscopist: Justice Britain , MD Age: 50 Referring MD:  Date of Birth: [birth year] Gender: Female Account #: [id number] Procedure:                Colonoscopy Indications:              Screening for colorectal malignant neoplasm, This                            is the patient's first colonoscopy Medicines:                Monitored Anesthesia Care Procedure:                Pre-Anesthesia Assessment:                           - Prior to the procedure, a History and Physical                            was performed, and patient medications and                            allergies were reviewed. The patient's tolerance of                            previous anesthesia was also reviewed. The risks                            and benefits of the procedure and the sedation                            options and risks were discussed with the patient.                            All questions were answered, and informed consent                            was obtained. Prior Anticoagulants: The patient has                            taken no previous anticoagulant or antiplatelet                            agents. ASA Grade Assessment: II - A patient with                            mild systemic disease. After reviewing the risks                            and benefits, the patient was deemed in                            satisfactory condition to undergo the procedure.  After obtaining informed consent, the colonoscope                            was passed under direct vision. Throughout the                            procedure, the patient's blood pressure, pulse, and                            oxygen saturations were monitored continuously. The                            CF [id number] #[id number] was introduced through the anus                            and advanced to the 5  cm into the ileum. The                            colonoscopy was performed without difficulty. The                            patient tolerated the procedure. The quality of the                            bowel preparation was good. The terminal ileum,                            ileocecal valve, appendiceal orifice, and rectum                            were photographed. Scope In: 10:09:41 AM Scope Out: 10:22:56 AM Scope Withdrawal Time: 0 hours 10 minutes 44 seconds  Total Procedure Duration: 0 hours 13 minutes 15 seconds  Findings:                 The digital rectal exam findings include                            hemorrhoids. Pertinent negatives include no                            palpable rectal lesions.                           The terminal ileum and ileocecal valve appeared                            normal.                           Normal mucosa was found in the entire colon.                           Non-bleeding non-thrombosed external and internal  hemorrhoids were found during retroflexion, during                            perianal exam and during digital exam. The                            hemorrhoids were Grade II (internal hemorrhoids                            that prolapse but reduce spontaneously). Complications:            No immediate complications. Estimated Blood Loss:     Estimated blood loss was minimal. Estimated blood                            loss: none. Impression:               - Hemorrhoids found on digital rectal exam.                           - The examined portion of the ileum was normal.                           - Normal mucosa in the entire examined colon.                           - Non-bleeding non-thrombosed external and internal                            hemorrhoids. Recommendation:           - The patient will be observed post-procedure,                            until all discharge criteria are met.                            - Discharge patient to home.                           - Patient has a contact number available for                            emergencies. The signs and symptoms of potential                            delayed complications were discussed with the                            patient. Return to normal activities tomorrow.                            Written discharge instructions were provided to the                            patient.                           -  High fiber diet.                           - Use FiberCon 1-2 tablets PO daily.                           - Continue present medications.                           - Repeat colonoscopy in 10 years for screening                            purposes.                           - The findings and recommendations were discussed                            with the patient.                           - The findings and recommendations were discussed                            with the patient's family. Justice Britain, MD 01/06/2021 10:26:30 AM

## 2021-01-06 NOTE — Patient Instructions (Signed)
Handout on high fiber diet. Use Fiber- Con 1-2 tablets daily   YOU HAD AN ENDOSCOPIC PROCEDURE TODAY AT THE [facility]:   Refer to the procedure report that was given to you for any specific questions about what was found during the examination.  If the procedure report does not answer your questions, please call your gastroenterologist to clarify.  If you requested that your care partner not be given the details of your procedure findings, then the procedure report has been included in a sealed envelope for you to review at your convenience later.  YOU SHOULD EXPECT: Some feelings of bloating in the abdomen. Passage of more gas than usual.  Walking can help get rid of the air that was put into your GI tract during the procedure and reduce the bloating. If you had a lower endoscopy (such as a colonoscopy or flexible sigmoidoscopy) you may notice spotting of blood in your stool or on the toilet paper. If you underwent a bowel prep for your procedure, you may not have a normal bowel movement for a few days.  Please Note:  You might notice some irritation and congestion in your nose or some drainage.  This is from the oxygen used during your procedure.  There is no need for concern and it should clear up in a day or so.  SYMPTOMS TO REPORT IMMEDIATELY:  Following lower endoscopy (colonoscopy or flexible sigmoidoscopy):  Excessive amounts of blood in the stool  Significant tenderness or worsening of abdominal pains  Swelling of the abdomen that is new, acute  Fever of 100F or higher  For urgent or emergent issues, a gastroenterologist can be reached at any hour by calling [phone]. Do not use MyChart messaging for urgent concerns.    DIET:  We do recommend a small meal at first, but then you may proceed to your regular diet.  Drink plenty of fluids but you should avoid alcoholic beverages for 24 hours.  ACTIVITY:  You should plan to take it easy for the rest of today and you  should NOT DRIVE or use heavy machinery until tomorrow (because of the sedation medicines used during the test).    FOLLOW UP: Our staff will call the number listed on your records 48-72 hours following your procedure to check on you and address any questions or concerns that you may have regarding the information given to you following your procedure. If we do not reach you, we will leave a message.  We will attempt to reach you two times.  During this call, we will ask if you have developed any symptoms of COVID 19. If you develop any symptoms (ie: fever, flu-like symptoms, shortness of breath, cough etc.) before then, please call [phone].  If you test positive for Covid 19 in the 2 weeks post procedure, please call and report this information to [place].    If any biopsies were taken you will be contacted by phone or by letter within the next 1-3 weeks.  Please call us at [phone] if you have not heard about the biopsies in 3 weeks.    SIGNATURES/CONFIDENTIALITY: You and/or your care partner have signed paperwork which will be entered into your electronic medical record.  These signatures attest to the fact that that the information above on your After Visit Summary has been reviewed and is understood.  Full responsibility of the confidentiality of this discharge information lies with you and/or your care-partner.

## 2021-01-06 NOTE — Progress Notes (Signed)
GASTROENTEROLOGY PROCEDURE H&P NOTE   Primary Care Physician: Jomarie Longs, PA-C  HPI: Rachel [name] is a 50 y.o. female who presents for Colonoscopy for screening.  Past Medical History:  Diagnosis Date   Asthma    childhood   Obesity    Seasonal allergies    Past Surgical History:  Procedure Laterality Date   TONSILLECTOMY     WISDOM TOOTH EXTRACTION     Current Outpatient Medications  Medication Sig Dispense Refill   MACA ROOT PO Take 2 capsules by mouth daily at 6 (six) AM.     VITAMIN D PO Take 1 tablet by mouth daily at 2 am.     VITAMIN K PO Take 2 capsules by mouth daily at 6 (six) AM.     albuterol (VENTOLIN HFA) 108 (90 Base) MCG/ACT inhaler Inhale 1-2 puffs into the lungs every 6 (six) hours as needed for wheezing or shortness of breath. 8 g 0   diclofenac Sodium (VOLTAREN) 1 % GEL Apply 4 g topically 4 (four) times daily. To affected joint. 100 g 5   fluconazole (DIFLUCAN) 150 MG tablet Take 150 mg by mouth as needed. (Patient not taking: Reported on 01/06/2021)     Current Facility-Administered Medications  Medication Dose Route Frequency Provider Last Rate Last Admin   0.9 %  sodium chloride infusion  500 mL Intravenous Once Mansouraty, Netty Starring., MD        Current Outpatient Medications:    MACA ROOT PO, Take 2 capsules by mouth daily at 6 (six) AM., Disp: , Rfl:    VITAMIN D PO, Take 1 tablet by mouth daily at 2 am., Disp: , Rfl:    VITAMIN K PO, Take 2 capsules by mouth daily at 6 (six) AM., Disp: , Rfl:    albuterol (VENTOLIN HFA) 108 (90 Base) MCG/ACT inhaler, Inhale 1-2 puffs into the lungs every 6 (six) hours as needed for wheezing or shortness of breath., Disp: 8 g, Rfl: 0   diclofenac Sodium (VOLTAREN) 1 % GEL, Apply 4 g topically 4 (four) times daily. To affected joint., Disp: 100 g, Rfl: 5   fluconazole (DIFLUCAN) 150 MG tablet, Take 150 mg by mouth as needed. (Patient not taking: Reported on 01/06/2021), Disp: , Rfl:   Current  Facility-Administered Medications:    0.9 %  sodium chloride infusion, 500 mL, Intravenous, Once, Mansouraty, Netty Starring., MD Allergies  Allergen Reactions   Augmentin [Amoxicillin-Pot Clavulanate]     GI symptoms   Prednisone    Family History  Problem Relation Age of Onset   Healthy Mother    Hypertension Father    Diabetes Maternal Aunt    Heart attack Maternal Uncle    Heart attack Maternal Grandfather    Pancreatic cancer Paternal Grandfather    Colon cancer Neg Hx    Esophageal cancer Neg Hx    Rectal cancer Neg Hx    Stomach cancer Neg Hx    Colon polyps Neg Hx    Social History   Socioeconomic History   Marital status: Married    Spouse name: Not on file   Number of children: Not on file   Years of education: Not on file   Highest education level: Not on file  Occupational History   Occupation: spanish interpreter  Tobacco Use   Smoking status: Former    Types: Cigarettes    Quit date: 09/01/1995    Years since quitting: 25.3   Smokeless tobacco: Never  Vaping Use  Vaping Use: Never used  Substance and Sexual Activity   Alcohol use: Yes    Alcohol/week: 1.0 standard drink    Types: 1 Standard drinks or equivalent per week    Comment: rare   Drug use: No   Sexual activity: Yes    Partners: Male    Birth control/protection: Other-see comments    Comment: spouse had vasectomy  Other Topics Concern   Not on file  Social History Narrative   Not on file   Social Determinants of Health   Financial Resource Strain: Not on file  Food Insecurity: Not on file  Transportation Needs: Not on file  Physical Activity: Not on file  Stress: Not on file  Social Connections: Not on file  Intimate Partner Violence: Not on file    Physical Exam: Today's Vitals   01/06/21 0913 01/06/21 0914  BP: 126/70 126/70  Pulse: 63 63  Temp: (!) 97.1 F (36.2 C) (!) 97.1 F (36.2 C)  SpO2: 97% 97%  Weight: 242 lb (109.8 kg) 242 lb (109.8 kg)  Height: 5\' 7"  (1.702 m)  5\' 7"  (1.702 m)   Body mass index is 37.9 kg/m. GEN: NAD EYE: Sclerae anicteric ENT: MMM CV: Non-tachycardic GI: Soft, NT/ND NEURO:  Alert & Oriented x 3  Lab Results: No results for input(s): WBC, HGB, HCT, PLT in the last 72 hours. BMET No results for input(s): NA, K, CL, CO2, GLUCOSE, BUN, CREATININE, CALCIUM in the last 72 hours. LFT No results for input(s): PROT, ALBUMIN, AST, ALT, ALKPHOS, BILITOT, BILIDIR, IBILI in the last 72 hours. PT/INR No results for input(s): LABPROT, INR in the last 72 hours.   Impression / Plan: This is a 50 y.o.female who presents for Colonoscopy for screening.  The risks and benefits of endoscopic evaluation/treatment were discussed with the patient and/or family; these include but are not limited to the risk of perforation, infection, bleeding, missed lesions, lack of diagnosis, severe illness requiring hospitalization, as well as anesthesia and sedation related illnesses.  The patient's history has been reviewed, patient examined, no change in status, and deemed stable for procedure.  The patient and/or family is agreeable to proceed.    , MD [facility] Office # [id number]

## 2021-01-08 ENCOUNTER — Telehealth: Payer: Self-pay | Admitting: *Deleted

## 2021-01-08 NOTE — Telephone Encounter (Signed)
  Follow up Call-  Call back number 01/06/2021  Post procedure Call Back phone  # [phone]  Permission to leave phone message Yes  Some recent data might be hidden     Patient questions:  Do you have a fever, pain , or abdominal swelling? No. Pain Score  0 *  Have you tolerated food without any problems? Yes.    Have you been able to return to your normal activities? Yes.    Do you have any questions about your discharge instructions: Diet   No. Medications  No. Follow up visit  No.  Do you have questions or concerns about your Care? No.  Actions: * If pain score is 4 or above: No action needed, pain <4.  Have you developed a fever since your procedure? no  2.   Have you had an respiratory symptoms (SOB or cough) since your procedure? no  3.   Have you tested positive for COVID 19 since your procedure no  4.   Have you had any family members/close contacts diagnosed with the COVID 19 since your procedure?  no   If yes to any of these questions please route to Laverna Peace, RN and Karlton Lemon, RN

## 2021-03-03 ENCOUNTER — Telehealth: Payer: Self-pay | Admitting: *Deleted

## 2021-03-03 NOTE — Telephone Encounter (Signed)
Left patient a message to call the office to schedule 6 month follow up appointment if still needed.

## 2021-03-18 ENCOUNTER — Telehealth: Payer: Self-pay | Admitting: Sports Medicine

## 2021-03-18 ENCOUNTER — Ambulatory Visit: Payer: Federal, State, Local not specified - PPO | Admitting: Sports Medicine

## 2021-03-18 ENCOUNTER — Other Ambulatory Visit: Payer: Self-pay

## 2021-03-18 DIAGNOSIS — M25572 Pain in left ankle and joints of left foot: Secondary | ICD-10-CM

## 2021-03-18 DIAGNOSIS — M25571 Pain in right ankle and joints of right foot: Secondary | ICD-10-CM

## 2021-03-18 DIAGNOSIS — M170 Bilateral primary osteoarthritis of knee: Secondary | ICD-10-CM | POA: Diagnosis not present

## 2021-03-18 DIAGNOSIS — G8929 Other chronic pain: Secondary | ICD-10-CM | POA: Diagnosis not present

## 2021-03-18 MED ORDER — CELECOXIB 200 MG PO CAPS: ORAL_CAPSULE | ORAL | 2 refills | Status: DC

## 2021-03-18 NOTE — Progress Notes (Signed)
    Procedures performed today:    None.  Independent interpretation of notes and tests performed by another provider:   None.  Brief History, Exam, Impression, and Recommendations:    Primary osteoarthritis of both knees Known knee osteoarthritis, worsening of symptoms, we try to get Orthovisc approved about a year and a half ago without success, she did have only a partial response to bilateral injections, she is currently using topical Voltaren without much improvement, adding Celebrex, home conditioning exercises, updated x-rays. We also are going to work on getting viscosupplementation approved again.  Bilateral ankle pain Bilateral ankle pain just distal and anterior to the lateral malleolus consistent with sinus tarsi syndrome, she does have some pes planus. Referral for custom molded orthotics, the Celebrex above will help, and I would like bilateral ankle x-rays.    ___________________________________________ Ihor Austin. Benjamin Stain, M.D., ABFM., CAQSM. Primary Care and Sports Medicine [facility]  Adjunct Instructor of Family Medicine  [employer]

## 2021-03-18 NOTE — Assessment & Plan Note (Signed)
Known knee osteoarthritis, worsening of symptoms, we try to get Orthovisc approved about a year and a half ago without success, she did have only a partial response to bilateral injections, she is currently using topical Voltaren without much improvement, adding Celebrex, home conditioning exercises, updated x-rays. We also are going to work on getting viscosupplementation approved again.

## 2021-03-18 NOTE — Assessment & Plan Note (Signed)
Bilateral ankle pain just distal and anterior to the lateral malleolus consistent with sinus tarsi syndrome, she does have some pes planus. Referral for custom molded orthotics, the Celebrex above will help, and I would like bilateral ankle x-rays.

## 2021-03-18 NOTE — Telephone Encounter (Signed)
Please work on bilateral Orthovisc or any other viscosupplementation ruble, bilateral x-ray confirmed knee osteoarthritis, failed steroid injections in the past, oral NSAIDs, activity modification and greater than 6 weeks of physician directed conservative treatment.

## 2021-03-19 ENCOUNTER — Ambulatory Visit: Payer: Federal, State, Local not specified - PPO | Admitting: Sports Medicine

## 2021-03-25 NOTE — Telephone Encounter (Signed)
MyVisco paperwork faxed to [employer] at [phone] Request is for Orthovisc Pt's insurance prefers Synvisc Fax confirmation receipt received  PA form for Synvisc faxed to to [facility] at [phone] Fax confirmation received.

## 2021-04-02 IMAGING — MR MR KNEE*L* W/O CM
7 series · 40 of 40 positions shown · non-contrast
Comparison: None.

CLINICAL DATA: Medial joint line pain. Left lateral knee pain for 2
months.

EXAM:
MRI OF THE LEFT KNEE WITHOUT CONTRAST
TECHNIQUE: Multiplanar, multisequence MR imaging of the knee was performed. No
intravenous contrast was administered.

[Series 3: T2 fat-sat · axial · 4.0mm · 0.53mm/px · z∈[-109,+61]mm · 6 of 35 slices shown (1 of 3)]
[im 1/35]
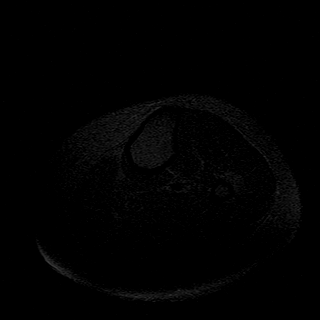
[im 7/35]
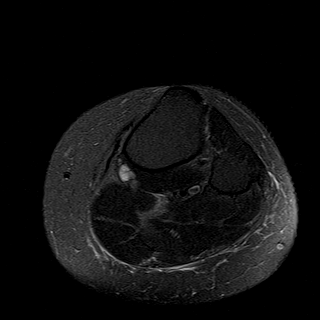
[im 14/35]
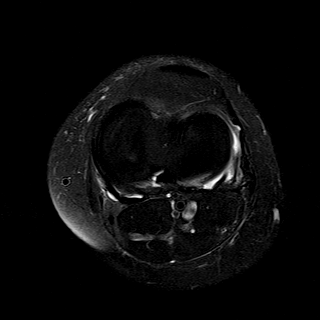
[im 21/35]
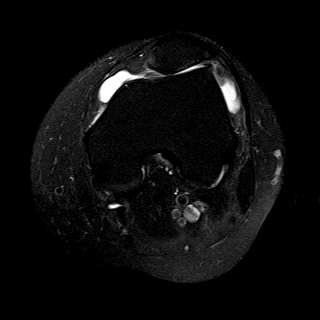
[im 28/35]
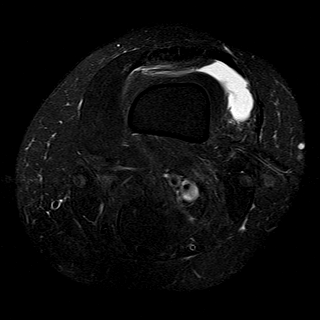
[im 35/35]
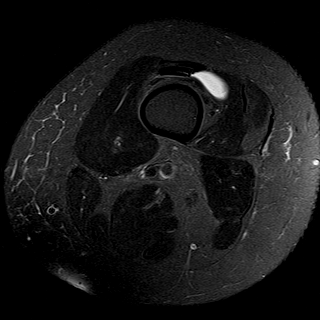

[Series 4: T1 · coronal · 4.0mm · 0.62mm/px · 6 of 31 slices shown]
[im 1/31]
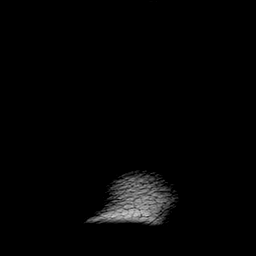
[im 7/31]
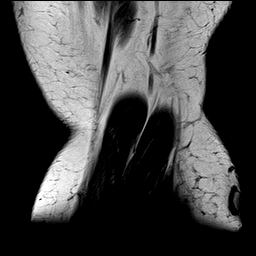
[im 13/31]
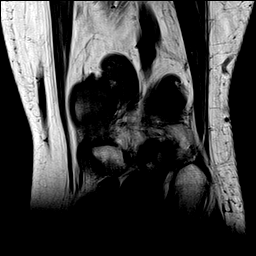
[im 19/31]
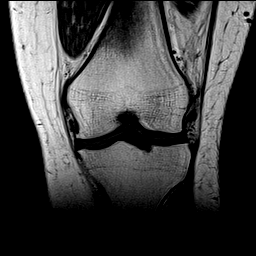
[im 25/31]
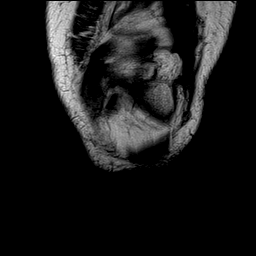
[im 31/31]
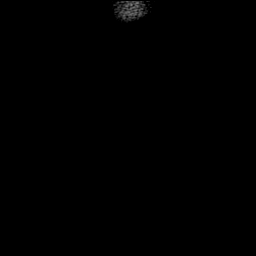

[Series 5: T2 fat-sat · coronal · 4.0mm · 0.62mm/px · 6 of 31 slices shown (2 of 3)]
[im 1/31]
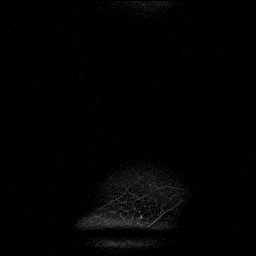
[im 7/31]
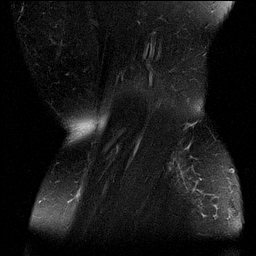
[im 13/31]
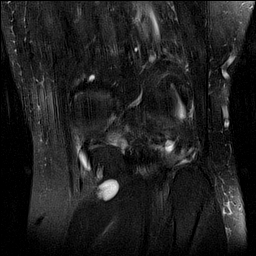
[im 19/31]
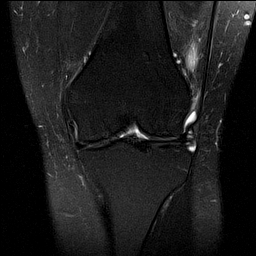
[im 25/31]
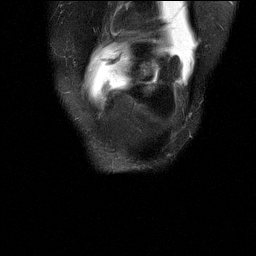
[im 31/31]
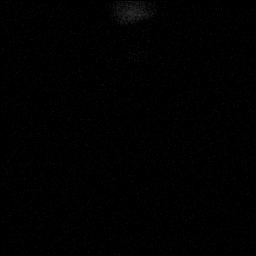

[Series 6: PD fat-sat · coronal · 4.0mm · 0.62mm/px · 6 of 31 slices shown (1 of 3)]
[im 1/31]
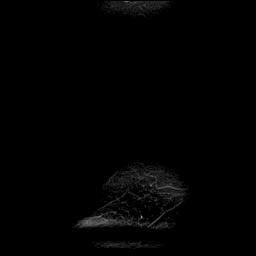
[im 7/31]
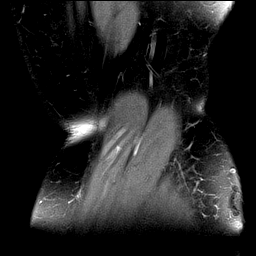
[im 13/31]
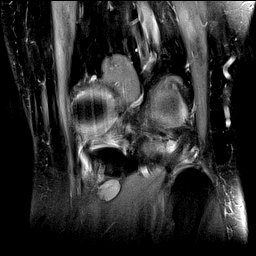
[im 19/31]
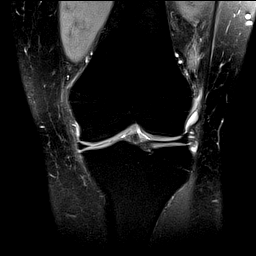
[im 25/31]
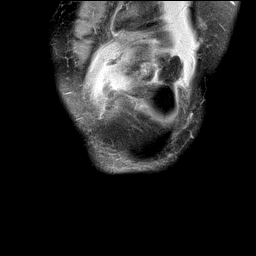
[im 31/31]
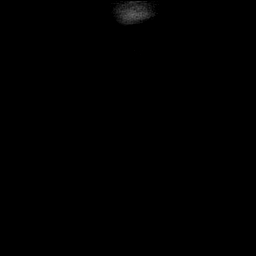

[Series 7: PD fat-sat · sagittal · 3.0mm · 0.62mm/px · 6 of 35 slices shown (2 of 3)]
[im 1/35]
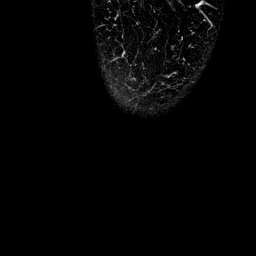
[im 7/35]
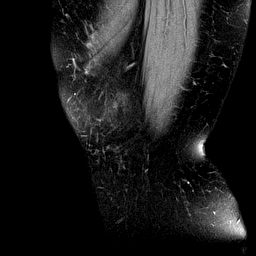
[im 14/35]
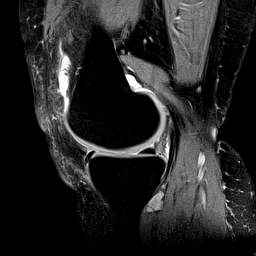
[im 21/35]
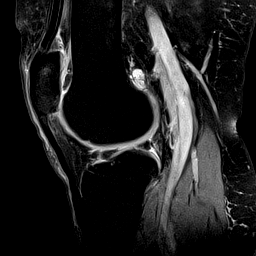
[im 28/35]
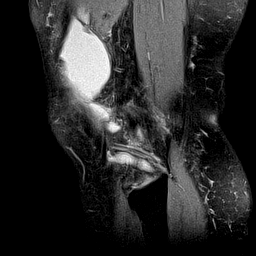
[im 35/35]
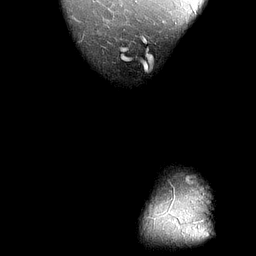

[Series 8: T2 fat-sat · sagittal · 3.0mm · 0.62mm/px · 6 of 35 slices shown (3 of 3)]
[im 1/35]
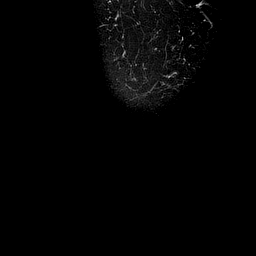
[im 7/35]
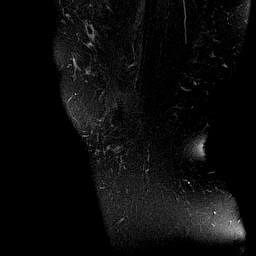
[im 14/35]
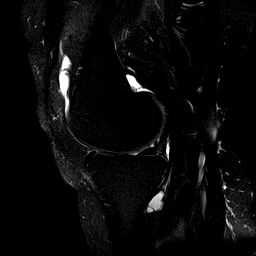
[im 21/35]
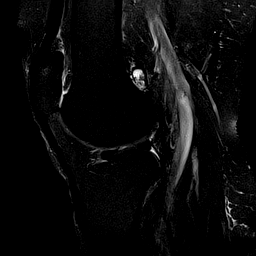
[im 28/35]
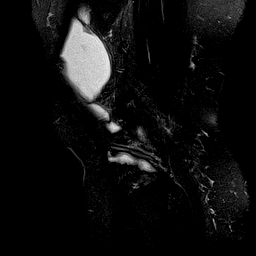
[im 35/35]
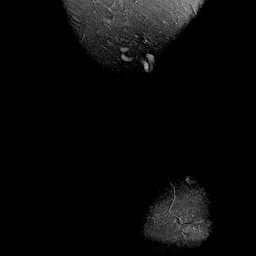

[Series 9: PD fat-sat · coronal · 2.0mm · 0.62mm/px · 4 of 19 slices shown (3 of 3)]
[im 1/19]
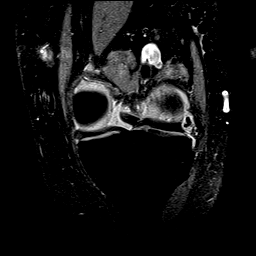
[im 7/19]
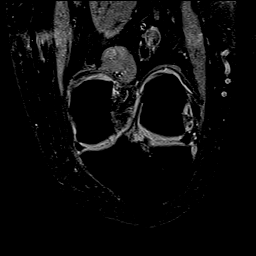
[im 13/19]
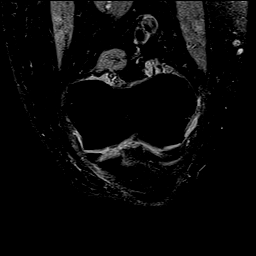
[im 19/19]
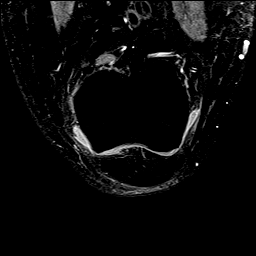

[40 of 40 positions shown; findings below may reference images not displayed]

FINDINGS: MENISCI

Medial meniscus:  Intact.

Lateral meniscus: Tiny radial tear of the free edge of the body of
the lateral meniscus.

LIGAMENTS

Cruciates:  Intact ACL and PCL.

Collaterals: Medial collateral ligament is intact. Lateral
collateral ligament complex is intact.

CARTILAGE

Patellofemoral: Full-thickness cartilage loss of the lateral
patellar facet with subchondral reactive marrow edema.
Partial-thickness cartilage loss of the lateral trochlea.

Medial:  No focal chondral defect.

Lateral: 8 mm full-thickness chondral defect of the posterior
nonweightbearing surface of the lateral femoral condyle.

Joint: Moderate joint effusion. Edema in superolateral Hoffa's fat.
No plical thickening.

Popliteal Fossa: No significant Baker's cyst. Intact popliteus
tendon.

Extensor Mechanism: Intact quadriceps tendon. Intact patellar
tendon. Intact medial patellar retinaculum. Intact lateral patellar
retinaculum. Intact MPFL.

Bones:  No acute osseous abnormality.  No aggressive osseous lesion.

Other: Small amount of fluid in the pes anserine bursa.
IMPRESSION: 1. Tiny radial tear of the free edge of the body of the lateral
meniscus.
2. Full-thickness cartilage loss of the lateral patellar facet with
subchondral reactive marrow edema. Partial-thickness cartilage loss
of the lateral trochlea.
3. A 8 mm full-thickness chondral defect of the posterior
nonweightbearing surface of the lateral femoral condyle.
4. Moderate joint effusion.
5. Edema in superolateral Hoffa's fat as can be seen with patellar
tendon-lateral femoral condyle friction syndrome.

## 2021-04-08 ENCOUNTER — Telehealth: Payer: Self-pay | Admitting: *Deleted

## 2021-04-08 NOTE — Telephone Encounter (Signed)
Left patient a message to call the office to schedule 6 month F/U appointment if still in need of appointment.

## 2021-04-15 ENCOUNTER — Ambulatory Visit (INDEPENDENT_AMBULATORY_CARE_PROVIDER_SITE_OTHER): Payer: Federal, State, Local not specified - PPO | Admitting: Sports Medicine

## 2021-04-15 ENCOUNTER — Other Ambulatory Visit: Payer: Self-pay

## 2021-04-15 DIAGNOSIS — J0100 Acute maxillary sinusitis, unspecified: Secondary | ICD-10-CM | POA: Diagnosis not present

## 2021-04-15 MED ORDER — PREDNISONE 50 MG PO TABS: 50.0000 mg | ORAL_TABLET | Freq: Every day | ORAL | 0 refills | Status: DC

## 2021-04-15 MED ORDER — AZITHROMYCIN 250 MG PO TABS: ORAL_TABLET | ORAL | 0 refills | Status: DC

## 2021-04-15 NOTE — Assessment & Plan Note (Signed)
This is a pleasant 51 year old female, she has had 2 to 3 weeks of fevers, chills, body aches, cough, sinus pain and pressure.  Purulent nasal drainage. Speaking full sentences on the phone, no shortness of breath. There was some influenza in the family 3 weeks ago. At this point I think she has an acute maxillary sinusitis, adding prednisone and azithromycin, she will return to see [place] in a week or so if not better.

## 2021-04-15 NOTE — Progress Notes (Signed)
° °  Virtual Visit via Telephone   I connected with  Rachel [name]  on 04/15/21 by telephone/telehealth and verified that I am speaking with the correct person using two identifiers.   I discussed the limitations, risks, security and privacy concerns of performing an evaluation and management service by telephone, including the higher likelihood of inaccurate diagnosis and treatment, and the availability of in person appointments.  We also discussed the likely need of an additional face to face encounter for complete and high quality delivery of care.  I also discussed with the patient that there may be a patient responsible charge related to this service. The patient expressed understanding and wishes to proceed.  Provider location is in medical facility. Patient location is at their home, different from provider location. People involved in care of the patient during this telehealth encounter were myself, my nurse/medical assistant, and my front office/scheduling team member.  Review of Systems: No fevers, chills, night sweats, weight loss, chest pain, or shortness of breath.   Objective Findings:    General: Speaking full sentences, no audible heavy breathing.  Sounds alert and appropriately interactive.    Independent interpretation of tests performed by another provider:   None.  Brief History, Exam, Impression, and Recommendations:    Acute maxillary sinusitis This is a pleasant 51 year old female, she has had 2 to 3 weeks of fevers, chills, body aches, cough, sinus pain and pressure.  Purulent nasal drainage. Speaking full sentences on the phone, no shortness of breath. There was some influenza in the family 3 weeks ago. At this point I think she has an acute maxillary sinusitis, adding prednisone and azithromycin, she will return to see [place] in a week or so if not better.   I discussed the above assessment and treatment plan with the patient. The patient was provided an opportunity  to ask questions and all were answered. The patient agreed with the plan and demonstrated an understanding of the instructions.   The patient was advised to call back or seek an in-person evaluation if the symptoms worsen or if the condition fails to improve as anticipated.   I provided 30 minutes of verbal and non-verbal time during this encounter date, time was needed to gather information, review chart, records, communicate/coordinate with staff remotely, as well as complete documentation.   ___________________________________________ Rachel [name]. Benjamin Stain, M.D., ABFM., CAQSM. Primary Care and Sports Medicine [facility]  Adjunct Professor of Family Medicine  [employer]

## 2021-04-22 NOTE — Telephone Encounter (Signed)
New benefit year As of 04/12/2021, [employer] is with Orthovisc  [employer] paperwork faxed to [employer] at [phone] Request is for [employer] prefers Orthovisc Fax confirmation receipt received

## 2021-04-27 ENCOUNTER — Ambulatory Visit: Payer: Federal, State, Local not specified - PPO | Admitting: Family Medicine

## 2021-04-27 DIAGNOSIS — M216X2 Other acquired deformities of left foot: Secondary | ICD-10-CM | POA: Diagnosis not present

## 2021-04-27 DIAGNOSIS — M216X1 Other acquired deformities of right foot: Secondary | ICD-10-CM | POA: Diagnosis not present

## 2021-04-27 NOTE — Progress Notes (Signed)
°  Rachel [name] - 51 y.o. female MRN [id number]  Date of birth: [birth year]  SUBJECTIVE:  Including CC & ROS.  No chief complaint on file.   Rachel [name] is a 51 y.o. female that is  here for bilateral ankle pain. Pain is worse with walking. Having pain on the lateral anterior ankle pain.     Review of Systems See HPI   HISTORY: Past Medical, Surgical, Social, and Family History Reviewed & Updated per EMR.   Pertinent Historical Findings include:  Past Medical History:  Diagnosis Date   Asthma    childhood   Obesity    Seasonal allergies     Past Surgical History:  Procedure Laterality Date   TONSILLECTOMY     WISDOM TOOTH EXTRACTION       PHYSICAL EXAM:  VS: BP 135/83    Ht 5\' 7"  (1.702 m)    Wt 232 lb (105.2 kg)    BMI 36.34 kg/m  Physical Exam Gen: NAD, alert, cooperative with exam, well-appearing MSK:  Neurovascularly intact    Patient was fitted for a smartcell orthotic. The orthotic was heated and afterward the patient stood on the orthotic blank positioned on the orthotic stand. The patient was positioned in subtalar neutral position and 10 degrees of ankle dorsiflexion in a weight bearing stance. After completion of molding, a stable base was applied to the orthotic blank. The blank was ground to a stable position for weight bearing. Size: 76M Pairs: 2 Base: Blue EVA Additional Posting and Padding: None The patient ambulated these, and they were very comfortable.    ASSESSMENT & PLAN:   Pronation deformity of both feet Appreciated pronation on exam. Getting suggestions of impingement.  - counseled on home exercise therapy and supportive care - orthotic  - could consider adding scaphoid pads.

## 2021-04-27 NOTE — Assessment & Plan Note (Signed)
Appreciated pronation on exam. Getting suggestions of impingement.  - counseled on home exercise therapy and supportive care - orthotic  - could consider adding scaphoid pads.

## 2021-04-30 NOTE — Telephone Encounter (Signed)
Spoke with Royce Macadamia at [facility] and she said that as long as the services for CPT code 20611 US done outpatient, a PA is not required.   Looking at the benefits investigation report, she will be responsible for 30% of each injection as well as a $150 copay per visit. The breakdown is:  $700/injection - 70% (ins covers) = $210 pt responsibility, per injection, per knee  Bilateral injections = $420 per visit x 4 visit     $1,680 total for treatment $150 copay per date of service = $600 total for treatment   Called and shared this info with the pt, and she would like to think about the injections and will let us know if she wants to proceed. She asked if this was the only option for her knee pain and I told her that she could continue with the conservative treatments already in place with Dr. Darene Lamer, which includes steroid knee injections every 3 months. I told her to call back when she is ready to schedule the injections, even if that means it is a few months down the road. Pt requested I send a MyChart message with the benefits breakdown for her reference. Message sent. No further questions or concerns at this time.

## 2021-04-30 NOTE — Telephone Encounter (Signed)
Benefits Investigation Details received from [employer] Injection: Orthovisc  Medical: Deductible does not apply. Once the OOP has been met, patient is covered at 100%. Only one copay applies per date of service. PA is required for the drug through [facility]. To initiate call [phone] PA required: Yes Will call BCBS at number listed above to initiate PA   Pharmacy: Benefits are currently undisclosed until a PA has been obtained for the drub. To obtain a PA, please contact [employer]   Specialty Pharmacy: not listed  May fill through: Buy and Bill [facility] Copay/Coinsurance: None Product Copay: 30% Administration Coinsurance: None Administration Copay: $150 Deductible: not listed Out of Pocket Max: $13,000 (met: $42.96)

## 2021-06-24 ENCOUNTER — Encounter: Payer: Federal, State, Local not specified - PPO | Admitting: Physician Assistant

## 2021-07-06 ENCOUNTER — Ambulatory Visit (INDEPENDENT_AMBULATORY_CARE_PROVIDER_SITE_OTHER): Payer: Federal, State, Local not specified - PPO | Admitting: Physician Assistant

## 2021-07-06 ENCOUNTER — Encounter: Payer: Self-pay | Admitting: Physician Assistant

## 2021-07-06 ENCOUNTER — Other Ambulatory Visit: Payer: Self-pay

## 2021-07-06 VITALS — BP 120/63 | HR 64 | Ht 67.0 in | Wt 247.0 lb

## 2021-07-06 DIAGNOSIS — Z1329 Encounter for screening for other suspected endocrine disorder: Secondary | ICD-10-CM

## 2021-07-06 DIAGNOSIS — Z1159 Encounter for screening for other viral diseases: Secondary | ICD-10-CM

## 2021-07-06 DIAGNOSIS — Z131 Encounter for screening for diabetes mellitus: Secondary | ICD-10-CM

## 2021-07-06 DIAGNOSIS — R4586 Emotional lability: Secondary | ICD-10-CM

## 2021-07-06 DIAGNOSIS — Z0000 Encounter for general adult medical examination without abnormal findings: Secondary | ICD-10-CM

## 2021-07-06 DIAGNOSIS — M722 Plantar fascial fibromatosis: Secondary | ICD-10-CM

## 2021-07-06 DIAGNOSIS — Z6838 Body mass index (BMI) 38.0-38.9, adult: Secondary | ICD-10-CM

## 2021-07-06 DIAGNOSIS — Z13220 Encounter for screening for lipoid disorders: Secondary | ICD-10-CM | POA: Diagnosis not present

## 2021-07-06 DIAGNOSIS — N951 Menopausal and female climacteric states: Secondary | ICD-10-CM

## 2021-07-06 DIAGNOSIS — E6609 Other obesity due to excess calories: Secondary | ICD-10-CM

## 2021-07-06 DIAGNOSIS — Z114 Encounter for screening for human immunodeficiency virus [HIV]: Secondary | ICD-10-CM

## 2021-07-06 MED ORDER — DICLOFENAC SODIUM 1 % EX GEL: 4.0000 g | Freq: Four times a day (QID) | CUTANEOUS | 5 refills | Status: AC

## 2021-07-06 MED ORDER — NALTREXONE-BUPROPION HCL ER 8-90 MG PO TB12: ORAL_TABLET | ORAL | 0 refills | Status: DC

## 2021-07-06 NOTE — Patient Instructions (Addendum)
Contrave pill for weight loss if too expensive we can send wellbutrin only ? ?Mancel Parsons is injection for weight loss once weekly ? ?Health Maintenance, Female ?Adopting a healthy lifestyle and getting preventive care are important in promoting health and wellness. Ask your health care provider about: ?The right schedule for you to have regular tests and exams. ?Things you can do on your own to prevent diseases and keep yourself healthy. ?What should I know about diet, weight, and exercise? ?Eat a healthy diet ? ?Eat a diet that includes plenty of vegetables, fruits, low-fat dairy products, and lean protein. ?Do not eat a lot of foods that are high in solid fats, added sugars, or sodium. ?Maintain a healthy weight ?Body mass index (BMI) is used to identify weight problems. It estimates body fat based on height and weight. Your health care provider can help determine your BMI and help you achieve or maintain a healthy weight. ?Get regular exercise ?Get regular exercise. This is one of the most important things you can do for your health. Most adults should: ?Exercise for at least 150 minutes each week. The exercise should increase your heart rate and make you sweat (moderate-intensity exercise). ?Do strengthening exercises at least twice a week. This is in addition to the moderate-intensity exercise. ?Spend less time sitting. Even light physical activity can be beneficial. ?Watch cholesterol and blood lipids ?Have your blood tested for lipids and cholesterol at 51 years of age, then have this test every 5 years. ?Have your cholesterol levels checked more often if: ?Your lipid or cholesterol levels are high. ?You are older than 51 years of age. ?You are at high risk for heart disease. ?What should I know about cancer screening? ?Depending on your health history and family history, you may need to have cancer screening at various ages. This may include screening for: ?Breast cancer. ?Cervical cancer. ?Colorectal  cancer. ?Skin cancer. ?Lung cancer. ?What should I know about heart disease, diabetes, and high blood pressure? ?Blood pressure and heart disease ?High blood pressure causes heart disease and increases the risk of stroke. This is more likely to develop in people who have high blood pressure readings or are overweight. ?Have your blood pressure checked: ?Every 3-5 years if you are 34-1 years of age. ?Every year if you are 16 years old or older. ?Diabetes ?Have regular diabetes screenings. This checks your fasting blood sugar level. Have the screening done: ?Once every three years after age 94 if you are at a normal weight and have a low risk for diabetes. ?More often and at a younger age if you are overweight or have a high risk for diabetes. ?What should I know about preventing infection? ?Hepatitis B ?If you have a higher risk for hepatitis B, you should be screened for this virus. Talk with your health care provider to find out if you are at risk for hepatitis B infection. ?Hepatitis C ?Testing is recommended for: ?Everyone born from [birth year] through 1965. ?Anyone with known risk factors for hepatitis C. ?Sexually transmitted infections (STIs) ?Get screened for STIs, including gonorrhea and chlamydia, if: ?You are sexually active and are younger than 51 years of age. ?You are older than 51 years of age and your health care provider tells you that you are at risk for this type of infection. ?Your sexual activity has changed since you were last screened, and you are at increased risk for chlamydia or gonorrhea. Ask your health care provider if you are at risk. ?Ask your health care  provider about whether you are at high risk for HIV. Your health care provider may recommend a prescription medicine to help prevent HIV infection. If you choose to take medicine to prevent HIV, you should first get tested for HIV. You should then be tested every 3 months for as long as you are taking the medicine. ?Pregnancy ?If you are  about to stop having your period (premenopausal) and you may become pregnant, seek counseling before you get pregnant. ?Take 400 to 800 micrograms (mcg) of folic acid every day if you become pregnant. ?Ask for birth control (contraception) if you want to prevent pregnancy. ?Osteoporosis and menopause ?Osteoporosis is a disease in which the bones lose minerals and strength with aging. This can result in bone fractures. If you are 49 years old or older, or if you are at risk for osteoporosis and fractures, ask your health care provider if you should: ?Be screened for bone loss. ?Take a calcium or vitamin D supplement to lower your risk of fractures. ?Be given hormone replacement therapy (HRT) to treat symptoms of menopause. ?Follow these instructions at home: ?Alcohol use ?Do not drink alcohol if: ?Your health care provider tells you not to drink. ?You are pregnant, may be pregnant, or are planning to become pregnant. ?If you drink alcohol: ?Limit how much you have to: ?0-1 drink a day. ?Know how much alcohol is in your drink. In the [place] one drink equals one 12 oz bottle of beer (355 mL), one 5 oz glass of wine (148 mL), or one 1? oz glass of hard liquor (44 mL). ?Lifestyle ?Do not use any products that contain nicotine or tobacco. These products include cigarettes, chewing tobacco, and vaping devices, such as e-cigarettes. If you need help quitting, ask your health care provider. ?Do not use street drugs. ?Do not share needles. ?Ask your health care provider for help if you need support or information about quitting drugs. ?General instructions ?Schedule regular health, dental, and eye exams. ?Stay current with your vaccines. ?Tell your health care provider if: ?You often feel depressed. ?You have ever been abused or do not feel safe at home. ?Summary ?Adopting a healthy lifestyle and getting preventive care are important in promoting health and wellness. ?Follow your health care provider's instructions about  healthy diet, exercising, and getting tested or screened for diseases. ?Follow your health care provider's instructions on monitoring your cholesterol and blood pressure. ?This information is not intended to replace advice given to you by your health care provider. Make sure you discuss any questions you have with your health care provider. ?Document Revised: 08/18/2020 Document Reviewed: 08/18/2020 ?Elsevier Patient Education ? [place]. ? ?

## 2021-07-06 NOTE — Progress Notes (Signed)
Subjective:  ?  ? Rachel [name] is a 51 y.o. female and is here for a comprehensive physical exam. The patient reports problems - pt has noticed some mood changes that she thinks is associated with peri-menopause . She started some OTC progesterone which she feels like has helped her mood a lot.  ? ? ?Social History  ? ?Socioeconomic History  ? Marital status: Married  ?  Spouse name: Not on file  ? Number of children: Not on file  ? Years of education: Not on file  ? Highest education level: Not on file  ?Occupational History  ? Occupation: spanish interpreter  ?Tobacco Use  ? Smoking status: Former  ?  Types: Cigarettes  ?  Quit date: 09/01/1995  ?  Years since quitting: 25.8  ? Smokeless tobacco: Never  ?Vaping Use  ? Vaping Use: Never used  ?Substance and Sexual Activity  ? Alcohol use: Yes  ?  Alcohol/week: 1.0 standard drink  ?  Types: 1 Standard drinks or equivalent per week  ?  Comment: rare  ? Drug use: No  ? Sexual activity: Yes  ?  Partners: Male  ?  Birth control/protection: Other-see comments  ?  Comment: spouse had vasectomy  ?Other Topics Concern  ? Not on file  ?Social History Narrative  ? Not on file  ? ?Social Determinants of Health  ? ?Financial Resource Strain: Not on file  ?Food Insecurity: Not on file  ?Transportation Needs: Not on file  ?Physical Activity: Not on file  ?Stress: Not on file  ?Social Connections: Not on file  ?Intimate Partner Violence: Not on file  ? ?Health Maintenance  ?Topic Date Due  ? Hepatitis C Screening  Never done  ? INFLUENZA VACCINE  07/10/2021 (Originally 11/10/2020)  ? COVID-19 Vaccine (3 - Moderna risk series) 07/22/2021 (Originally 12/10/2019)  ? Zoster Vaccines- Shingrix (1 of 2) 10/06/2021 (Originally 12/25/1989)  ? MAMMOGRAM  09/10/2021  ? PAP SMEAR-Modifier  07/04/2023  ? TETANUS/TDAP  12/01/2024  ? COLONOSCOPY (Pts 45-43yrs Insurance coverage will need to be confirmed)  01/07/2031  ? HIV Screening  Completed  ? HPV VACCINES  Aged Out  ? ? ?The following portions  of the patient's history were reviewed and updated as appropriate: allergies, current medications, past family history, past medical history, past social history, past surgical history, and problem list. ? ?Review of Systems ?A comprehensive review of systems was negative.  ? ?Objective:  ? ? BP 120/63   Pulse 64   Ht 5\' 7"  (1.702 m)   Wt 247 lb (112 kg)   SpO2 96%   BMI 38.69 kg/m?  ?General appearance: alert, cooperative, appears stated age, and moderately obese ?Head: Normocephalic, without obvious abnormality, atraumatic ?Eyes: conjunctivae/corneas clear. PERRL, EOM's intact. Fundi benign. ?Ears: normal TM's and external ear canals both ears ?Nose: Nares normal. Septum midline. Mucosa normal. No drainage or sinus tenderness. ?Throat: lips, mucosa, and tongue normal; teeth and gums normal ?Neck: no adenopathy, no carotid bruit, no JVD, supple, symmetrical, trachea midline, and thyroid not enlarged, symmetric, no tenderness/mass/nodules ?Back: symmetric, no curvature. ROM normal. No CVA tenderness. ?Lungs: clear to auscultation bilaterally ?Heart: regular rate and rhythm, S1, S2 normal, no murmur, click, rub or gallop ?Abdomen: soft, non-tender; bowel sounds normal; no masses,  no organomegaly ?Extremities: extremities normal, atraumatic, no cyanosis or edema ?Pulses: 2+ and symmetric ?Skin: Skin color, texture, turgor normal. No rashes or lesions ?Lymph nodes: Cervical, supraclavicular, and axillary nodes normal. ?Neurologic: Alert and oriented X 3, normal  strength and tone. Normal symmetric reflexes. Normal coordination and gait  ? Marland Kitchen. ? ?  07/06/2021  ?  9:35 AM 06/06/2019  ? 10:01 AM 05/05/2018  ?  8:35 AM 08/19/2017  ?  1:16 PM 01/05/2017  ? 10:05 AM  ?Depression screen PHQ 2/9  ?Decreased Interest 0 0 0 0 0  ?Down, Depressed, Hopeless 1 0 0 0 0  ?PHQ - 2 Score 1 0 0 0 0  ?Altered sleeping 0 0 0 0   ?Tired, decreased energy 0 1 1 0   ?Change in appetite 0 1 1 0   ?Feeling bad or failure about yourself  0 0 3 0    ?Trouble concentrating 1 0 0 0   ?Moving slowly or fidgety/restless 0 0 0 0   ?Suicidal thoughts 0 0 0 0   ?PHQ-9 Score 2 2 5  0   ?Difficult doing work/chores  Not difficult at all Not difficult at all Not difficult at all   ? ?.. ? ?  07/06/2021  ?  9:36 AM 06/06/2019  ? 10:00 AM 05/05/2018  ?  8:35 AM 08/19/2017  ?  1:16 PM  ?GAD 7 : Generalized Anxiety Score  ?Nervous, Anxious, on Edge 0 0 0 0  ?Control/stop worrying 1 0 0 0  ?Worry too much - different things 0 0 0 0  ?Trouble relaxing 0 0 0 0  ?Restless 0 0 0 0  ?Easily annoyed or irritable 1 0 3 0  ?Afraid - awful might happen 0 0 0 0  ?Total GAD 7 Score 2 0 3 0  ?Anxiety Difficulty  Not difficult at all Not difficult at all   ? ? ? ?Assessment:  ? ? Healthy female exam.   ?  ?Plan:  ?..Rachel [name] was seen today for annual exam. ? ?Diagnoses and all orders for this visit: ? ?Routine physical examination ?-     TSH ?-     Lipid Panel w/reflex Direct LDL ?-     COMPLETE METABOLIC PANEL WITH GFR ?-     CBC with Differential/Platelet ?-     Estradiol ?-     FSH/LH ?-     Progesterone ? ?Thyroid disorder screen ?-     TSH ? ?Diabetes mellitus screening ?-     COMPLETE METABOLIC PANEL WITH GFR ? ?Lipid screening ?-     Lipid Panel w/reflex Direct LDL ? ?Class 2 obesity due to excess calories without serious comorbidity with body mass index (BMI) of 38.0 to 38.9 in adult ?-     Naltrexone-buPROPion HCl ER 8-90 MG TB12; 1 tab daily for week 1, then 1 tab BID for week 2, then 2 tab PO qAM and 1 tab PO qPM for week 3, then 2 tabs BID. ? ?Encounter for hepatitis C screening test for low risk patient ?-     Hepatitis C Antibody ? ?Screening for HIV without presence of risk factors ?-     HIV antibody (with reflex) ? ?Mood changes ?-     Estradiol ?-     FSH/LH ?-     Progesterone ? ?Perimenopausal ? ?Plantar fasciitis, bilateral ?-     diclofenac Sodium (VOLTAREN) 1 % GEL; Apply 4 g topically 4 (four) times daily. To affected joint. ? ? ?.. ?Discussed 150 minutes of exercise a  week.  ?Encouraged vitamin D 1000 units and Calcium 1300mg  or 4 servings of dairy a day.  ?PHQ/GAD no concerns ?Fasting labs ordered ?Pap UTD. ?Mammogram UTD.  ?Colonoscopy UTD.  ?  Declined shingles/flu/covid vaccine. ? ?Mood changes associated with perimenopausal symptoms ?Hormones checked today ? ?Marland Kitchen..Discussed low carb diet with 1500 calories and 80g of protein.  ?Exercising at least 150 minutes a week.  ?My Fitness Pal could be a [employer] resource.  ?Discussed options ?Decided on trying contrave ?Discussed SE and titration up ?Follow up in 3 months.  ? ? ? ? ?See After Visit Summary for Counseling Recommendations  ? ?

## 2021-07-07 ENCOUNTER — Encounter: Payer: Self-pay | Admitting: Physician Assistant

## 2021-07-07 DIAGNOSIS — Z780 Asymptomatic menopausal state: Secondary | ICD-10-CM | POA: Insufficient documentation

## 2021-07-07 NOTE — Progress Notes (Signed)
WBC looks good.  ?Hemoglobin looks good.  ?Kidney and liver look great.  ?Thyroid looks wonderful. ?Cholesterol looks GREAT! ?Your are definitely in menopause. Your estrogen levels are really low. If having more menopausal symptoms then we could consider different treatment options.  ?Fasting glucose a little elevated.  ?JJ please add A1C.

## 2021-07-10 LAB — CBC WITH DIFFERENTIAL/PLATELET
Absolute Monocytes: 396 cells/uL (ref 200–950)
Basophils Absolute: 30 cells/uL (ref 0–200)
Basophils Relative: 0.5 %
Eosinophils Absolute: 90 cells/uL (ref 15–500)
Eosinophils Relative: 1.5 %
HCT: 43.2 % (ref 35.0–45.0)
Hemoglobin: 13.8 g/dL (ref 11.7–15.5)
Lymphs Abs: 1896 cells/uL (ref 850–3900)
MCH: 28.6 pg (ref 27.0–33.0)
MCHC: 31.9 g/dL — ABNORMAL LOW (ref 32.0–36.0)
MCV: 89.4 fL (ref 80.0–100.0)
MPV: 9.4 fL (ref 7.5–12.5)
Monocytes Relative: 6.6 %
Neutro Abs: 3588 cells/uL (ref 1500–7800)
Neutrophils Relative %: 59.8 %
Platelets: 344 10*3/uL (ref 140–400)
RBC: 4.83 10*6/uL (ref 3.80–5.10)
RDW: 14.1 % (ref 11.0–15.0)
Total Lymphocyte: 31.6 %
WBC: 6 10*3/uL (ref 3.8–10.8)

## 2021-07-10 LAB — COMPLETE METABOLIC PANEL WITH GFR
AG Ratio: 1.8 (calc) (ref 1.0–2.5)
ALT: 21 U/L (ref 6–29)
AST: 16 U/L (ref 10–35)
Albumin: 4.5 g/dL (ref 3.6–5.1)
Alkaline phosphatase (APISO): 112 U/L (ref 37–153)
BUN: 11 mg/dL (ref 7–25)
CO2: 28 mmol/L (ref 20–32)
Calcium: 10.1 mg/dL (ref 8.6–10.4)
Chloride: 104 mmol/L (ref 98–110)
Creat: 0.7 mg/dL (ref 0.50–1.03)
Globulin: 2.5 g/dL (calc) (ref 1.9–3.7)
Glucose, Bld: 102 mg/dL — ABNORMAL HIGH (ref 65–99)
Potassium: 4.6 mmol/L (ref 3.5–5.3)
Sodium: 139 mmol/L (ref 135–146)
Total Bilirubin: 0.4 mg/dL (ref 0.2–1.2)
Total Protein: 7 g/dL (ref 6.1–8.1)
eGFR: 105 mL/min/{1.73_m2} (ref >=60)

## 2021-07-10 LAB — LIPID PANEL W/REFLEX DIRECT LDL
Cholesterol: 175 mg/dL (ref <200)
HDL: 63 mg/dL (ref >=50)
LDL Cholesterol (Calc): 91 mg/dL (calc)
Non-HDL Cholesterol (Calc): 112 mg/dL (calc) (ref <130)
Total CHOL/HDL Ratio: 2.8 (calc) (ref <5.0)
Triglycerides: 111 mg/dL (ref <150)

## 2021-07-10 LAB — HEMOGLOBIN A1C W/OUT EAG: Hgb A1c MFr Bld: 5.5 % of total Hgb (ref <5.7)

## 2021-07-10 LAB — FSH/LH
FSH: 39.3 m[IU]/mL
LH: 35.4 m[IU]/mL

## 2021-07-10 LAB — TSH: TSH: 1.03 mIU/L

## 2021-07-10 LAB — ESTRADIOL: Estradiol: <15 pg/mL

## 2021-07-10 LAB — HIV ANTIBODY (ROUTINE TESTING W REFLEX): HIV 1&2 Ab, 4th Generation: NONREACTIVE

## 2021-07-10 LAB — HEPATITIS C ANTIBODY
Hepatitis C Ab: NONREACTIVE
SIGNAL TO CUT-OFF: 0.07 (ref <1.00)

## 2021-07-10 LAB — PROGESTERONE: Progesterone: <0.5 ng/mL

## 2021-07-13 NOTE — Progress Notes (Signed)
A1C is normal

## 2021-08-18 ENCOUNTER — Emergency Department
Admission: RE | Admit: 2021-08-18 | Discharge: 2021-08-18 | Disposition: A | Discharge status: Home / Self Care | Payer: Federal, State, Local not specified - PPO | Source: Ambulatory Visit | Attending: Family Medicine | Admitting: Family Medicine

## 2021-08-18 ENCOUNTER — Other Ambulatory Visit: Payer: Self-pay

## 2021-08-18 VITALS — BP 126/75 | HR 76 | Temp 98.3°F | Resp 16 | Ht 67.0 in | Wt 242.0 lb

## 2021-08-18 DIAGNOSIS — S161XXA Strain of muscle, fascia and tendon at neck level, initial encounter: Secondary | ICD-10-CM | POA: Diagnosis not present

## 2021-08-18 DIAGNOSIS — J3089 Other allergic rhinitis: Secondary | ICD-10-CM | POA: Diagnosis not present

## 2021-08-18 DIAGNOSIS — H66003 Acute suppurative otitis media without spontaneous rupture of ear drum, bilateral: Secondary | ICD-10-CM | POA: Diagnosis not present

## 2021-08-18 MED ORDER — CYCLOBENZAPRINE HCL 10 MG PO TABS: 10.0000 mg | ORAL_TABLET | Freq: Two times a day (BID) | ORAL | 0 refills | Status: DC | PRN

## 2021-08-18 MED ORDER — METHYLPREDNISOLONE ACETATE 80 MG/ML IJ SUSP
80.0000 mg | Freq: Once | INTRAMUSCULAR | Status: AC
  Administered 2021-08-18: 80 mg via INTRAMUSCULAR

## 2021-08-18 MED ORDER — CEFDINIR 300 MG PO CAPS: 300.0000 mg | ORAL_CAPSULE | Freq: Two times a day (BID) | ORAL | 0 refills | Status: DC

## 2021-08-18 NOTE — ED Provider Notes (Signed)
?[facility] ? ? ? ?CSN: [id number] ?Arrival date & time: 08/18/21  1345 ? ? ?  ? ?History   ?Chief Complaint ?Chief Complaint  ?Patient presents with  ? Ear Fullness  ?  Neck and ear pain shooting down to my shoulder and up to my head. - Entered by patient  ? Neck Pain  ? ? ?HPI ?Rachel [name] is a 51 y.o. female.  ? ?HPI ? ?Patient states that she did some spray painting and had her right arm elevated going back and forth a number of times.  She developed pain in her right trapezius area and points to the upper trapezius at the shoulder.  She states that that has persisted.  She has stiffness in the muscles. ?At about the same time, but likely unrelated she had allergy symptoms.  She does not think it is from fumes from the painting.  She has stuffy nose, ear pressure and pain, ringing in her ears, and shooting pains in her ears at times.  She has allergies.  She takes an antihistamine and a steroid spray. ?No fever or chills ?No chest congestion or cough ?No exposure to illness ? ?Past Medical History:  ?Diagnosis Date  ? Asthma   ? childhood  ? Obesity   ? Seasonal allergies   ? ? ?Patient Active Problem List  ? Diagnosis Date Noted  ? Post-menopausal 07/07/2021  ? Mood changes 07/06/2021  ? Acute maxillary sinusitis 04/15/2021  ? Bilateral ankle pain 03/18/2021  ? Seasonal allergies 06/10/2020  ? Eustachian tube dysfunction, bilateral 06/10/2020  ? Bilateral hearing loss 06/10/2020  ? Primary osteoarthritis of both knees 09/05/2019  ? Abnormal weight gain 06/06/2019  ? Perimenopausal 06/06/2019  ? Menopausal symptoms 05/05/2018  ? Mass of right eye 01/13/2018  ? Blepharitis of right upper eyelid 01/13/2018  ? Accessory skin tags 01/13/2018  ? Class 2 obesity due to excess calories without serious comorbidity with body mass index (BMI) of 36.0 to 36.9 in adult 08/19/2017  ? Pronation deformity of both feet 04/10/2015  ? Morton's neuroma of left foot 03/10/2015  ? Metatarsal deformity 03/10/2015  ?  Plantar fasciitis, bilateral 03/10/2015  ? Allergic rhinitis 12/17/2010  ? Premenstrual syndrome 12/17/2010  ? Tonsil stone 12/17/2010  ? ? ?Past Surgical History:  ?Procedure Laterality Date  ? TONSILLECTOMY    ? WISDOM TOOTH EXTRACTION    ? ? ?OB History   ? ? Gravida  ?2  ? Para  ?2  ? Term  ?2  ? Preterm  ?   ? AB  ?   ? Living  ?2  ?  ? ? SAB  ?   ? IAB  ?   ? Ectopic  ?   ? Multiple  ?   ? Live Births  ?   ?   ?  ?  ? ? ? ?Home Medications   ? ?Prior to Admission medications   ?Medication Sig Start Date End Date Taking? Authorizing Provider  ?cefdinir (OMNICEF) 300 MG capsule Take 1 capsule (300 mg total) by mouth 2 (two) times daily. 08/18/21  Yes Eustace MooreNelson, Dajon Rowe Sue, MD  ?cyclobenzaprine (FLEXERIL) 10 MG tablet Take 1 tablet (10 mg total) by mouth 2 (two) times daily as needed for muscle spasms. 08/18/21  Yes Eustace MooreNelson, Laasya Peyton Sue, MD  ?fluticasone Aleda Grana(FLONASE) 50 MCG/ACT nasal spray Place into both nostrils daily.   Yes [provider]  ?albuterol (VENTOLIN HFA) 108 (90 Base) MCG/ACT inhaler Inhale 1-2 puffs into the lungs every  6 (six) hours as needed for wheezing or shortness of breath. 09/20/19   Lurene Shadow, PA-C  ?diclofenac Sodium (VOLTAREN) 1 % GEL Apply 4 g topically 4 (four) times daily. To affected joint. 07/06/21   Breeback, Lonna Cobb, PA-C  ?fexofenadine (ALLEGRA) 180 MG tablet Take 180 mg by mouth daily.    [provider]  ?MACA ROOT PO Take 2 capsules by mouth daily at 6 (six) AM.    [provider]  ?Misc Natural Products (PROGESTERONE EX) Apply topically.    [provider]  ?VITAMIN D PO Take 1 tablet by mouth daily at 2 am.    [provider]  ?VITAMIN K PO Take 2 capsules by mouth daily at 6 (six) AM.    [provider]  ? ? ?Family History ?Family History  ?Problem Relation Age of Onset  ? Healthy Mother   ? Hypertension Father   ? Diabetes Maternal Aunt   ? Heart attack Maternal Uncle   ? Heart attack Maternal Grandfather   ? Pancreatic cancer  Paternal Grandfather   ? Colon cancer Neg Hx   ? Esophageal cancer Neg Hx   ? Rectal cancer Neg Hx   ? Stomach cancer Neg Hx   ? Colon polyps Neg Hx   ? ? ?Social History ?Social History  ? ?Tobacco Use  ? Smoking status: Former  ?  Types: Cigarettes  ?  Quit date: 09/01/1995  ?  Years since quitting: 25.9  ? Smokeless tobacco: Never  ?Vaping Use  ? Vaping Use: Never used  ?Substance Use Topics  ? Alcohol use: Yes  ?  Alcohol/week: 1.0 standard drink  ?  Types: 1 Standard drinks or equivalent per week  ?  Comment: rare  ? Drug use: No  ? ? ? ?Allergies   ?Augmentin [amoxicillin-pot clavulanate] ? ? ?Review of Systems ?Review of Systems ?See HPI ? ?Physical Exam ?Triage Vital Signs ?ED Triage Vitals  ?Enc Vitals Group  ?   BP 08/18/21 1403 126/75  ?   Pulse Rate 08/18/21 1403 76  ?   Resp 08/18/21 1403 16  ?   Temp 08/18/21 1403 98.3 ?F (36.8 ?C)  ?   Temp Source 08/18/21 1403 Oral  ?   SpO2 08/18/21 1403 98 %  ?   Weight 08/18/21 1404 242 lb (109.8 kg)  ?   Height 08/18/21 1404 5\' 7"  (1.702 m)  ?   Head Circumference --   ?   Peak Flow --   ?   Pain Score 08/18/21 1404 7  ?   Pain Loc --   ?   Pain Edu? --   ?   Excl. in GC? --   ? ?No data found. ? ?Updated Vital Signs ?BP 126/75 (BP Location: Left Arm)   Pulse 76   Temp 98.3 ?F (36.8 ?C) (Oral)   Resp 16   Ht 5\' 7"  (1.702 m)   Wt 109.8 kg   SpO2 98%   BMI 37.90 kg/m?  ? ?   ? ?Physical Exam ?Constitutional:   ?   General: She is not in acute distress. ?   Appearance: She is well-developed. She is obese.  ?HENT:  ?   Head: Normocephalic and atraumatic.  ?   Right Ear: Ear canal and external ear normal.  ?   Left Ear: Ear canal and external ear normal.  ?   Ears:  ?   Comments: Both TMs are dull.  Slight injection ?  Nose: Congestion and rhinorrhea present.  ?   Comments: Clear rhinorrhea ?   Mouth/Throat:  ?   Pharynx: No oropharyngeal exudate or posterior oropharyngeal erythema.  ?Eyes:  ?   Conjunctiva/sclera: Conjunctivae normal.  ?   Pupils: Pupils are  equal, round, and reactive to light.  ?Neck:  ? ?Cardiovascular:  ?   Rate and Rhythm: Normal rate.  ?   Heart sounds: Normal heart sounds.  ?Pulmonary:  ?   Effort: Pulmonary effort is normal. No respiratory distress.  ?   Breath sounds: Normal breath sounds. No wheezing or rhonchi.  ?Abdominal:  ?   General: There is no distension.  ?   Palpations: Abdomen is soft.  ?Musculoskeletal:     ?   General: Normal range of motion.  ?   Cervical back: Full passive range of motion without pain and normal range of motion. Tenderness present.  ?Skin: ?   General: Skin is warm and dry.  ?Neurological:  ?   Mental Status: She is alert.  ?Psychiatric:     ?   Mood and Affect: Mood normal.     ?   Behavior: Behavior normal.  ? ? ? ?UC Treatments / Results  ?Labs ?(all labs ordered are listed, but only abnormal results are displayed) ?Labs Reviewed - No data to display ? ?EKG ? ? ?Radiology ?No results found. ? ?Procedures ?Procedures (including critical care time) ? ?Medications Ordered in UC ?Medications  ?methylPREDNISolone acetate (DEPO-MEDROL) injection 80 mg (80 mg Intramuscular Given 08/18/21 1513)  ? ? ?Initial Impression / Assessment and Plan / UC Course  ?I have reviewed the triage vital signs and the nursing notes. ? ?Pertinent labs & imaging results that were available during my care of the patient were reviewed by me and considered in my medical decision making (see chart for details). ? ?  ? ?Concern for ear pain, dull appearance of TMs, and injection.  We will treat with antibiotics ?Marland Kitchen  Over-the-counter ibuprofen or Aleve for pain.  Flexeril at nighttime.  See PCP if not improving in a few days ?Final Clinical Impressions(s) / UC Diagnoses  ? ?Final diagnoses:  ?Non-recurrent acute suppurative otitis media of both ears without spontaneous rupture of tympanic membranes  ?Environmental and seasonal allergies  ?Strain of neck muscle, initial encounter  ? ? ? ?Discharge Instructions   ? ?  ?Drink lots of fluids ?The  steroid shot should last several days ?Take antibiotic 2 times a day for a week ?Take over-the-counter ibuprofen or Aleve for pain ?Take cyclobenzaprine as muscle relaxer.  May cause drowsiness.  This is helpful at nigh

## 2021-08-18 NOTE — Discharge Instructions (Addendum)
Drink lots of fluids ?The steroid shot should last several days ?Take antibiotic 2 times a day for a week ?Take over-the-counter ibuprofen or Aleve for pain ?Take cyclobenzaprine as muscle relaxer.  May cause drowsiness.  This is helpful at night ?See your doctor if not improving by end of week ?

## 2021-08-18 NOTE — ED Triage Notes (Signed)
Rt neck pain x 5 days, Rt ear pain, ringing in ear, shooting pain radiating up into head and down neck.  ?

## 2021-08-20 ENCOUNTER — Ambulatory Visit: Payer: Federal, State, Local not specified - PPO | Admitting: Obstetrics and Gynecology

## 2021-08-20 ENCOUNTER — Encounter: Payer: Self-pay | Admitting: Obstetrics and Gynecology

## 2021-08-20 VITALS — BP 131/82 | HR 69 | Ht 67.0 in | Wt 242.1 lb

## 2021-08-20 DIAGNOSIS — R102 Pelvic and perineal pain: Secondary | ICD-10-CM | POA: Diagnosis not present

## 2021-08-20 NOTE — Progress Notes (Signed)
? ?GYNECOLOGY OFFICE NOTE ? ?History:  ?51 y.o. [id number] here today for pelvic pain in right lower abdomen.  Started using progesterone cream to regulate hormone levels. She is having sharp pain 1-2 x per month. Can't lay on right side when this happens. Lasts 30 secs at a time, maybe happens 2-3 x per day. Gets worse with urination, doesn't occur when she is standing. Is sharp, stabbing pain. 6/10. Overall, gotten worse over time as it is more frequent.  ? ?No issues with urination. Regular bowel movements.  ? ?Past Medical History:  ?Diagnosis Date  ? Asthma   ? childhood  ? Obesity   ? Seasonal allergies   ? ? ?Past Surgical History:  ?Procedure Laterality Date  ? TONSILLECTOMY    ? WISDOM TOOTH EXTRACTION    ? ? ? ?Current Outpatient Medications:  ?  albuterol (VENTOLIN HFA) 108 (90 Base) MCG/ACT inhaler, Inhale 1-2 puffs into the lungs every 6 (six) hours as needed for wheezing or shortness of breath., Disp: 8 g, Rfl: 0 ?  cefdinir (OMNICEF) 300 MG capsule, Take 1 capsule (300 mg total) by mouth 2 (two) times daily., Disp: 14 capsule, Rfl: 0 ?  cyclobenzaprine (FLEXERIL) 10 MG tablet, Take 1 tablet (10 mg total) by mouth 2 (two) times daily as needed for muscle spasms., Disp: 20 tablet, Rfl: 0 ?  diclofenac Sodium (VOLTAREN) 1 % GEL, Apply 4 g topically 4 (four) times daily. To affected joint., Disp: 100 g, Rfl: 5 ?  fexofenadine (ALLEGRA) 180 MG tablet, Take 180 mg by mouth daily., Disp: , Rfl:  ?  fluticasone (FLONASE) 50 MCG/ACT nasal spray, Place into both nostrils daily., Disp: , Rfl:  ?  MACA ROOT PO, Take 2 capsules by mouth daily at 6 (six) AM., Disp: , Rfl:  ?  Misc Natural Products (PROGESTERONE EX), Apply topically., Disp: , Rfl:  ?  VITAMIN D PO, Take 1 tablet by mouth daily at 2 am., Disp: , Rfl:  ?  VITAMIN K PO, Take 2 capsules by mouth daily at 6 (six) AM., Disp: , Rfl:  ? ?The following portions of the patient's history were reviewed and updated as appropriate: allergies, current medications,  past family history, past medical history, past social history, past surgical history and problem list.  ? ?Review of Systems:  ?Pertinent items noted in HPI and remainder of comprehensive ROS otherwise negative. ?  ?Objective:  ?Physical Exam ?BP 131/82   Pulse 69   Ht 5\' 7"  (1.702 m)   Wt 242 lb 1 oz (109.8 kg)   BMI 37.91 kg/m?  ?CONSTITUTIONAL: Well-developed, well-nourished female in no acute distress.  ?HENT:  Normocephalic, atraumatic. External right and left ear normal. Oropharynx is clear and moist ?EYES: Conjunctivae and EOM are normal. Pupils are equal, round, and reactive to light. No scleral icterus.  ?NECK: Normal range of motion, supple, no masses ?SKIN: Skin is warm and dry. No rash noted. Not diaphoretic. No erythema. No pallor. ?NEUROLOGIC: Alert and oriented to person, place, and time. Normal reflexes, muscle tone coordination. No cranial nerve deficit noted. ?PSYCHIATRIC: Normal mood and affect. Normal behavior. Normal judgment and thought content. ?CARDIOVASCULAR: Normal heart rate noted ?RESPIRATORY: Effort normal, no problems with respiration noted ?ABDOMEN: Soft, no distention noted. Moderate tenderness on right lower abdomen with deep palpation ?PELVIC: Normal appearing external genitalia; normal appearing vaginal mucosa and cervix.  No abnormal discharge noted.   Normal uterine size, no other palpable masses, no uterine or adnexal tenderness. ?MUSCULOSKELETAL: Normal range of  motion. No edema noted. ? ?Exam done with chaperone present. ? ?Labs and Imaging ?No results found. ? ?Assessment & Plan:  ?1. Pelvic pain ?Given regular intermittent pain, suspect ovarian cyst, will obtain US to rule out ?- less likely GI etiology given regular BM and no other symptoms ?- US PELVIC COMPLETE WITH TRANSVAGINAL; Future ? ? ?Routine preventative health maintenance measures emphasized. ?Please refer to After Visit Summary for other counseling recommendations.  ? ?Return if symptoms worsen or fail to  improve, for will contact patient with results for follow up. ? ?K. Therese Sarah, MD, FACOG ?Attending ?[facility]) ? ? ? ?

## 2021-08-26 ENCOUNTER — Ambulatory Visit (INDEPENDENT_AMBULATORY_CARE_PROVIDER_SITE_OTHER): Payer: Federal, State, Local not specified - PPO

## 2021-08-26 ENCOUNTER — Ambulatory Visit: Payer: Federal, State, Local not specified - PPO

## 2021-08-26 ENCOUNTER — Encounter: Payer: Self-pay | Admitting: Physician Assistant

## 2021-08-26 ENCOUNTER — Other Ambulatory Visit: Payer: Self-pay | Admitting: Obstetrics and Gynecology

## 2021-08-26 DIAGNOSIS — M1711 Unilateral primary osteoarthritis, right knee: Secondary | ICD-10-CM | POA: Diagnosis not present

## 2021-08-26 DIAGNOSIS — M25571 Pain in right ankle and joints of right foot: Secondary | ICD-10-CM

## 2021-08-26 DIAGNOSIS — M1712 Unilateral primary osteoarthritis, left knee: Secondary | ICD-10-CM | POA: Diagnosis not present

## 2021-08-26 DIAGNOSIS — N858 Other specified noninflammatory disorders of uterus: Secondary | ICD-10-CM | POA: Diagnosis not present

## 2021-08-26 DIAGNOSIS — M170 Bilateral primary osteoarthritis of knee: Secondary | ICD-10-CM | POA: Diagnosis not present

## 2021-08-26 DIAGNOSIS — Z1231 Encounter for screening mammogram for malignant neoplasm of breast: Secondary | ICD-10-CM

## 2021-08-26 DIAGNOSIS — Z780 Asymptomatic menopausal state: Secondary | ICD-10-CM | POA: Diagnosis not present

## 2021-08-26 DIAGNOSIS — M25561 Pain in right knee: Secondary | ICD-10-CM

## 2021-08-26 DIAGNOSIS — R102 Pelvic and perineal pain: Secondary | ICD-10-CM

## 2021-08-26 DIAGNOSIS — G8929 Other chronic pain: Secondary | ICD-10-CM | POA: Diagnosis not present

## 2021-08-26 DIAGNOSIS — N898 Other specified noninflammatory disorders of vagina: Secondary | ICD-10-CM | POA: Diagnosis not present

## 2021-08-26 DIAGNOSIS — M25572 Pain in left ankle and joints of left foot: Secondary | ICD-10-CM | POA: Diagnosis not present

## 2021-08-26 DIAGNOSIS — M25562 Pain in left knee: Secondary | ICD-10-CM | POA: Diagnosis not present

## 2021-09-01 ENCOUNTER — Other Ambulatory Visit: Payer: Self-pay

## 2021-09-01 DIAGNOSIS — R102 Pelvic and perineal pain: Secondary | ICD-10-CM

## 2021-09-21 ENCOUNTER — Encounter: Payer: Self-pay | Admitting: Physician Assistant

## 2021-09-22 MED ORDER — BUPROPION HCL ER (SR) 100 MG PO TB12: 100.0000 mg | ORAL_TABLET | Freq: Two times a day (BID) | ORAL | 2 refills | Status: DC

## 2021-09-23 ENCOUNTER — Ambulatory Visit (INDEPENDENT_AMBULATORY_CARE_PROVIDER_SITE_OTHER): Payer: Federal, State, Local not specified - PPO

## 2021-09-23 DIAGNOSIS — Z1231 Encounter for screening mammogram for malignant neoplasm of breast: Secondary | ICD-10-CM | POA: Diagnosis not present

## 2021-10-01 ENCOUNTER — Encounter: Payer: Self-pay | Admitting: Obstetrics and Gynecology

## 2021-10-01 ENCOUNTER — Ambulatory Visit: Payer: Federal, State, Local not specified - PPO | Admitting: Obstetrics and Gynecology

## 2021-10-01 VITALS — BP 131/82 | HR 73 | Wt 247.0 lb

## 2021-10-01 DIAGNOSIS — N950 Postmenopausal bleeding: Secondary | ICD-10-CM | POA: Diagnosis not present

## 2021-10-01 NOTE — Progress Notes (Signed)
Pt is in the office reporting postmenopausal bleeding. She states that she was using a progesterone cream once daily and realized that it should be twice/day, symptoms started shortly after. Reports bleeding started on 09/17/21 with light bleeding and became heavier soaking a pad and lasted until 09/27/2021. Pt states that she also experienced cramping and breasts tenderness.

## 2021-10-02 ENCOUNTER — Ambulatory Visit (INDEPENDENT_AMBULATORY_CARE_PROVIDER_SITE_OTHER): Payer: Federal, State, Local not specified - PPO

## 2021-10-02 DIAGNOSIS — R102 Pelvic and perineal pain: Secondary | ICD-10-CM | POA: Diagnosis not present

## 2021-10-02 DIAGNOSIS — N888 Other specified noninflammatory disorders of cervix uteri: Secondary | ICD-10-CM | POA: Diagnosis not present

## 2021-10-07 ENCOUNTER — Ambulatory Visit: Payer: Federal, State, Local not specified - PPO | Admitting: Physician Assistant

## 2021-10-15 ENCOUNTER — Other Ambulatory Visit (HOSPITAL_COMMUNITY)
Admission: RE | Admit: 2021-10-15 | Discharge: 2021-10-15 | Disposition: A | Discharge status: Home / Self Care | Payer: Federal, State, Local not specified - PPO | Source: Ambulatory Visit | Attending: Obstetrics and Gynecology | Admitting: Obstetrics and Gynecology

## 2021-10-15 ENCOUNTER — Encounter: Payer: Self-pay | Admitting: Obstetrics and Gynecology

## 2021-10-15 ENCOUNTER — Ambulatory Visit: Payer: Federal, State, Local not specified - PPO | Admitting: Obstetrics and Gynecology

## 2021-10-15 VITALS — BP 126/83 | HR 81 | Ht 67.0 in | Wt 247.0 lb

## 2021-10-15 DIAGNOSIS — N950 Postmenopausal bleeding: Secondary | ICD-10-CM | POA: Insufficient documentation

## 2021-10-15 DIAGNOSIS — Z01812 Encounter for preprocedural laboratory examination: Secondary | ICD-10-CM

## 2021-10-15 LAB — POCT URINE PREGNANCY: Preg Test, Ur: NEGATIVE

## 2021-10-15 NOTE — Progress Notes (Signed)
ENDOMETRIAL BIOPSY      Rachel [name] is a 51 y.o. [id number] here for endometrial biopsy.  The indications for endometrial biopsy were reviewed.  Risks of the biopsy including cramping, bleeding, infection, uterine perforation, inadequate specimen and need for additional procedures were discussed. The patient states she understands and agrees to undergo procedure today. Consent was signed. Time out was performed.   Indications: post menopausal bleeding Urine HCG: negative  A bivalve speculum was placed into the vagina and the cervix was easily visualized and was prepped with Betadine x2. A single-toothed tenaculum was placed on the anterior lip of the cervix to stabilize it. The 3 mm pipelle was introduced into the endometrial cavity without difficulty to a depth of 7.5 cm, and a moderate amount of tissue was obtained and sent to pathology. This was repeated for a total of 3 passes. The instruments were removed from the patient's vagina. Minimal bleeding from the cervix at the tenaculum was noted.   The patient tolerated the procedure well. Routine post-procedure instructions were given to the patient.    Will base further management on results of biopsy.  Baldemar Lenis, MD, [facility] Attending [facility])

## 2021-10-19 ENCOUNTER — Ambulatory Visit (INDEPENDENT_AMBULATORY_CARE_PROVIDER_SITE_OTHER): Payer: Federal, State, Local not specified - PPO | Admitting: Psychology

## 2021-10-19 DIAGNOSIS — F3341 Major depressive disorder, recurrent, in partial remission: Secondary | ICD-10-CM | POA: Diagnosis not present

## 2021-10-19 LAB — SURGICAL PATHOLOGY

## 2021-10-19 NOTE — Progress Notes (Addendum)
[facility] Counsel[facility] Initial Adult Intake  Name: Rachel Rachel [name] Date: 10/19/2021 MRN: [id number] DOB: 1970/11/09 PCP: Rachel Stade, PA-C  Time spent: 12:00 - 12:55 PM  Guardian/Payee:  Rachel Rachel [name]    Paperwork requested: Yes   Today I met with  Rachel Rachel [name] in remote video (WebEx) face-to-face individual psychotherapy.  Distance Site: Client's Home Orginating Site: Dr Rachel Rachel [name] Remote Office Consent: Obtained verbal consent to transmit  session remotely   Reason for Visit /Presenting Problem:  Pt has previously been in individual and couple's therapy with this therapist.  Rachel Rachel [name] states that they are having difficulties with their adult son who moved back in.  Their son Rachel Rachel [name] has ADHD and he hasn't been complying with their ground rules.  They have been getting into frequent arguments and want some assistance in navigating the situation and to work on their communication.  Rachel Rachel [name] is also learning to negotiate work life, her husband's retirement and the changes in their relationship dynamics.  Rachel Rachel [name] have been married for 31 years.  They met in high school.  Rachel [name] was her brother's best friend in high school.  Mackinley always had an eye on him but because he was her brother's friend he never thought of her that way.  Rachel [name] was in the service in [place] and Zohra was living in [place].  After several years post graduation, he finally asked her out.  Six months later they got married.  They actually dated for two weeks the rest was long distance.    Rachel Bar "Rachel Rachel [name]" (27)  she has a 36 year old baby Rachel [name].  Rachel Rachel [name] is working full time at [employer] (service department) She is no longer with the baby's father but she is co-parenting with Rachel [name].  She is engaged to Rachel [name] (50) it was a problem initially but they are adjusting.  Rachel Rachel [name] (42) he received his degree in biology at [employer]. He is now doing IT work Multimedia programmer for [employer].  He was living with his girlfriend  and they were very serious.  Last year in December, he became concerned about certain behavior and they separated.  Now he is see her occasionally but they are uncertain where the relationship is going.   Appearance: Casual    Behavior: Appropriate Motor: Normal Speech/Language: Normal Rate Affect: Appropriate Mood: normal Thought process: normal Thought content: WNL Sensory/Perceptual disturbances: WNL Orientation: oriented to person, place, time/date, and situation Attention: Good Concentration: Good Memory: WNL Fund of knowledge: Good Insight: Good Judgment: Good Impulse Control: Good  Reported Symptoms:  Pt endorses stress, worry, anger towards son, frequent arguing and confusion about what to do.  PHQ 9 and GAD 7 were both in the mild.  Risk Assessment: Danger to Self:  No Self-injurious Behavior: No Danger to Others: No Duty to Warn:no Physical Aggression / Violence:No  Access to Firearms a concern: No  Substance Abuse History: Current substance abuse: No     Past Psychiatric History:   Previous psychological history is significant for ADHD, anxiety, and depression Outpatient Providers: returning to therapy with current therapist History of Psych Hospitalization: No  Psychological Testing: ADHD   Abuse History:  Victim of: No.,  N/a    Report needed: No. Victim of Neglect:No. Witness / Exposure to Domestic Violence: No   Protective Services Involvement: No  Witness to [employer] Violence:  No   Family History:  Family History  Problem Relation Age of Onset   Healthy Mother    Hypertension Father  Diabetes Maternal Aunt    Heart attack Maternal Uncle    Heart attack Maternal Grandfather    Pancreatic cancer Paternal Grandfather    Colon cancer Neg Hx    Esophageal cancer Neg Hx    Rectal cancer Neg Hx    Stomach cancer Neg Hx    Colon polyps Neg Hx     Living situation: the patient lives with their spouse  Sexual Orientation:  Straight  Relationship Status: married  Name of spouse / other: Rachel [name] ( If a parent, number of children / ages: 2  Support Systems: spouse friends  Museum/gallery curator Stress:  No   Income/Employment/Disability: Employment  Armed forces logistics/support/administrative officer: No   Educational History: Education: Scientist, product/process development: Protestant  Any cultural differences that may affect / interfere with treatment: require an understanding of Hispanic culture  Recreation/Hobbies:   Stressors: Financial difficulties   Marital or family conflict   Occupational concerns    Strengths: Supportive Relationships, Family, Friends, Social worker, and Spirituality  Barriers:  None   Legal History: Pending legal issue / charges:  n/a. History of legal issue / charges:  n/a  Medical History/Surgical History: reviewed Past Medical History:  Diagnosis Date   Asthma    childhood   Obesity    Seasonal allergies     Past Surgical History:  Procedure Laterality Date   TONSILLECTOMY     WISDOM TOOTH EXTRACTION      Medications: Current Outpatient Medications  Medication Sig Dispense Refill   albuterol (VENTOLIN HFA) 108 (90 Base) MCG/ACT inhaler Inhale 1-2 puffs into the lungs every 6 (six) hours as needed for wheezing or shortness of breath. 8 g 0   diclofenac Sodium (VOLTAREN) 1 % GEL Apply 4 g topically 4 (four) times daily. To affected joint. 100 g 5   fexofenadine (ALLEGRA) 180 MG tablet Take 180 mg by mouth daily.     fluticasone (FLONASE) 50 MCG/ACT nasal spray Place into both nostrils daily.     MACA ROOT PO Take 2 capsules by mouth daily at 6 (six) AM.     Misc Natural Products (PROGESTERONE EX) Apply topically.     VITAMIN D PO Take 1 tablet by mouth daily at 2 am.     VITAMIN K PO Take 2 capsules by mouth daily at 6 (six) AM.     No current facility-administered medications for this visit.    Allergies  Allergen Reactions   Augmentin [Amoxicillin-Pot Clavulanate] Nausea Only    GI  symptoms    Diagnoses:  No diagnosis found.  Plan of Care: Recent stage of life changes have added additional stresses which have exacerbated existing problems.  Ladona would appear to benefit from individual therapy as well as some marital therapy to work on phase of life changes and communication problems.  A few session together with her son Rachel [name] be conducted if necessary.  Treatment would focus on learning and implementing communication, problem solving skills, learning to tolerate conflict/differences as well as facilitate a positive transition to phase of life changes. In addition, further examination of possible underlying ADHD issues contributing largely to ongoing conflicts.  [name], PhD

## 2021-10-27 ENCOUNTER — Other Ambulatory Visit: Payer: Federal, State, Local not specified - PPO

## 2021-10-28 ENCOUNTER — Ambulatory Visit (INDEPENDENT_AMBULATORY_CARE_PROVIDER_SITE_OTHER): Payer: Federal, State, Local not specified - PPO | Admitting: Psychology

## 2021-10-28 DIAGNOSIS — F3341 Major depressive disorder, recurrent, in partial remission: Secondary | ICD-10-CM | POA: Diagnosis not present

## 2021-10-28 NOTE — Progress Notes (Addendum)
PROGRESS NOTE  Name: Trenna Kiely Date: 10/28/2021 MRN: [id number] DOB: [birth year] PCP: Donella Stade, PA-C  Time spent: 1:00 - 1:55 PM  Today I met with  [name] in remote video (WebEx) face-to-face individual psychotherapy.  Distance Site: Client's Home Orginating Site: Dr Jannifer Franklin Remote Office Consent: Obtained verbal consent to transmit  session remotely    Reason for Visit /Presenting Problem:  Pt has previously been in individual and couple's therapy with this therapist.  Chrisanna states that they are having difficulties with their adult son who moved back in.  Their son [name] has ADHD and he hasn't been complying with their ground rules.  They have been getting into frequent arguments and want some assistance in navigating the situation and to work on their communication.  Brigitta is also learning to negotiate work life, her husband's retirement and the changes in their relationship dynamics.  Madelin and Norm have been married for 31 years.  They met in high school.  Norm was her brother's best friend in high school.  Jemima always had an eye on him but because he was her brother's friend he never thought of her that way.  Norm was in the service in [place] and Preslie was living in [place].  After several years post graduation, he finally asked her out.  Six months later they got married.  They actually dated for two weeks the rest was long distance.    [name] "[name]" (27)  she has a 47 year old baby [name].  [name] is working full time at [employer] (service department) She is no longer with the baby's father but she is co-parenting with Will.  She is engaged to [name] (50) it was a problem initially but they are adjusting.  [name] (70) he received his degree in biology at [employer]. He is now doing IT work Multimedia programmer for [employer].  He was living with his girlfriend and they were very serious.  Last year in December, he became concerned about certain behavior and they  separated.  Now he is see her occasionally but they are uncertain where the relationship is going.   Individualized Treatment Plan                Strengths: bright, verbal, resourceful and motivated  Supports: spouse, adult children and friends   Goal/Needs for Treatment:  In order of importance to patient 1) Learn and implement communication and problem solving skills  2) Learn and implement skills to understand and resolve current interpersonal problems in order to develop healthy interpersonal relationships that lead to the alleviation and help prevent the relapse of depression. 3) Learn and implement conflict management skills to facilitate a more rewarding relationship with adult children.   Client Statement of Needs: seeks assistance in improving her relationship with her adult son, improve communication and learn how to resolve conflict   Treatment Level: Outpatient Individual Psychotherapy  Symptoms: Rosha c/o that she is struggling to focus and concentrate, decreased motivation, she procrastinates and finds it hard to get somethings started. Sets high expectations for herself and she feels like she is failing herself and it makes her feel bad (related to her weight) and she feels like she is more slowed down (more due to the pain in her knee). Endorsed worrying about different things - everyday things such as getting a job. She worries some about needing to work because Norm will be retiring soon.    Client Treatment Preferences: restart therapy with  previous therapist   Healthcare consumer's goal for treatment:  Psychologist, [name], Ph.D. will support the patient's ability to achieve the goals identified. Cognitive Behavioral Therapy, Dialectical Behavioral Therapy, Motivational Interviewing, Behavior Activation and other evidenced-based practices will be used to promote progress towards healthy functioning.   Healthcare consumer Todd Argabright will: Actively participate in  therapy, working towards healthy functioning.    *Justification for Continuation/Discontinuation of Goal: R=Revised, O=Ongoing, A=Achieved, D=Discontinued  Goal 1) Learn and implement communication and problem solving skills    5 Point Likert rating baseline date: 10/28/2021 Target Date Goal Was reviewed Status Code Progress towards goal/Likert rating  10/29/2022           O              Goal 2) Learn and implement skills to understand and resolve current interpersonal problems in order to develop  healthy interpersonal relationships that lead to the alleviation and help prevent the relapse of depression.   5 Point Likert rating baseline date: 10/28/2021 Target Date Goal Was reviewed Status Code Progress towards goal/Likert rating  10/29/2022           O              Goal 3) Learn and implement conflict management skills to facilitate a more rewarding relationship with adult children.  5 Point Likert rating baseline date: 10/28/2021 Target Date Goal Was reviewed Status Code Progress towards goal/Likert rating  10/29/2022            O              This plan has been reviewed and created by the following participants:  This plan will be reviewed at least every 12 months. Date Behavioral Health Clinician Date Guardian/Patient   10/28/2021 [name], Ph.D.   10/28/2021 [name]                     Diagnoses:  Depression, major, recurrent, in partial remission [facility])  Plan of Care: Recent stage of life changes have added additional stresses which have exacerbated existing problems.  Skylor would appear to benefit from individual therapy as well as some marital therapy to work on phase of life changes and communication problems.  A few sessions together with her son will be conducted if necessary.  Treatment would focus on learning and implementing communication, problem solving skills, learning to tolerate conflict/differences as well as facilitate a positive transition to phase  of life changes. In addition, further examination of possible underlying ADHD issues contributing largely to ongoing conflicts.     [name], PhD

## 2021-11-02 ENCOUNTER — Ambulatory Visit (INDEPENDENT_AMBULATORY_CARE_PROVIDER_SITE_OTHER): Payer: Federal, State, Local not specified - PPO | Admitting: Psychology

## 2021-11-02 DIAGNOSIS — F3341 Major depressive disorder, recurrent, in partial remission: Secondary | ICD-10-CM

## 2021-11-02 NOTE — Progress Notes (Signed)
PROGRESS NOTE  Name: Rachel [name] Date: 11/02/2021 MRN: [id number] DOB: 10-05-1970 PCP: Donella Stade, PA-C  Time spent: 12:00 - 12:55 PM  Today I met with  Rachel [name], Rachel [name] and their son Rachel [name] in remote video (WebEx) face-to-face individual psychotherapy.  Distance Site: Client's Home Orginating Site: Dr Jannifer Franklin Remote Office Consent: Obtained verbal consent to transmit  session remotely    Reason for Visit /Presenting Problem:  Pt has previously been in individual and couple's therapy with this therapist.  Rachel [name] states that they are having difficulties with their adult son who moved back in.  Their son Rachel [name] has ADHD and he hasn't been complying with their ground rules.  They have been getting into frequent arguments and want some assistance in navigating the situation and to work on their communication.  Rachel [name] is also learning to negotiate work life, her husband's retirement and the changes in their relationship dynamics.  Rachel [name] and Rachel have been married for 31 years.  They met in high school.  Rachel was her brother's best friend in high school.  Rachel [name] always had an eye on him but because he was her brother's friend he never thought of her that way.  Rachel was in the service in [place] and Rachel [name] was living in [place].  After several years post graduation, he finally asked her out.  Six months later they got married.  They actually dated for two weeks the rest was long distance.    Rachel Bar "[name]" (27)  she has a 37 year old baby Rachel [name].  [name] is working full time at [employer] She is no longer with the baby's father but she is co-parenting with Rachel [name].  She is engaged to Rachel [name] (50) it was a problem initially but they are adjusting.  Rachel [name] (82) he received his degree in biology at [employer]. He is now doing IT work Multimedia programmer for [employer].  He was living with his girlfriend and they were very serious.  Last year in December, he became  concerned about certain behavior and they separated.  Now he is see her occasionally but they are uncertain where the relationship is going.   Individualized Treatment Plan                Strengths: bright, verbal, resourceful and motivated  Supports: spouse, adult children and friends   Goal/Needs for Treatment:  In order of importance to patient 1) Learn and implement communication and problem solving skills  2) Learn and implement skills to understand and resolve current interpersonal problems in order to develop healthy interpersonal relationships that lead to the alleviation and help prevent the relapse of depression. 3) Learn and implement conflict management skills to facilitate a more rewarding relationship with adult children.   Client Statement of Needs: seeks assistance in improving her relationship with her adult son, improve communication and learn how to resolve conflict   Treatment Level: Outpatient Individual Psychotherapy  Symptoms: Rachel [name] c/o that she is struggling to focus and concentrate, decreased motivation, she procrastinates and finds it hard to get somethings started. Sets high expectations for herself and she feels like she is failing herself and it makes her feel bad (related to her weight) and she feels like she is more slowed down (more due to the pain in her knee). Endorsed worrying about different things - everyday things such as getting a job. She worries some about needing to work because Rachel Rachel [name] be retiring soon.  Client Treatment Preferences: restart therapy with previous therapist   Healthcare consumer's goal for treatment:  Psychologist, Rachel [name], Ph.D. Rachel [name] support the patient's ability to achieve the goals identified. Cognitive Behavioral Therapy, Dialectical Behavioral Therapy, Motivational Interviewing, Behavior Activation and other evidenced-based practices Rachel [name] be used to promote progress towards healthy functioning.   Healthcare consumer  Teigan Manner Rachel [name]: Actively participate in therapy, working towards healthy functioning.    *Justification for Continuation/Discontinuation of Goal: R=Revised, O=Ongoing, A=Achieved, D=Discontinued  Goal 1) Learn and implement communication and problem solving skills    5 Point Likert rating baseline date: 10/28/2021 Target Date Goal Was reviewed Status Code Progress towards goal/Likert rating  10/29/2022           O              Goal 2) Learn and implement skills to understand and resolve current interpersonal problems in order to develop  healthy interpersonal relationships that lead to the alleviation and help prevent the relapse of depression.   5 Point Likert rating baseline date: 10/28/2021 Target Date Goal Was reviewed Status Code Progress towards goal/Likert rating  10/29/2022           O              Goal 3) Learn and implement conflict management skills to facilitate a more rewarding relationship with adult children.  5 Point Likert rating baseline date: 10/28/2021 Target Date Goal Was reviewed Status Code Progress towards goal/Likert rating  10/29/2022           O              This plan has been reviewed and created by the following participants:  This plan Rachel [name] be reviewed at least every 12 months. Date Behavioral Health Clinician Date Guardian/Patient   10/28/2021 Rachel [name], Ph.D.   10/28/2021 Rachel [name]                     Diagnoses:  Depression, major, recurrent, in partial remission [facility])  Today we had a family session with [name], her husband Rachel and their son Rachel [name].  We processed the issues that arose while he was temporarily living with his parents.  The agenda was focused on how to improve their communication and their relationship in general going forward.  We d/e/p navigating the transition between parenting children and parenting adults.  And Rachel [name] transitioning to learning to relate to his parents as an adult.  We talked about making an effort to have  regular time to get together to catch up and have the opportunity to communicate.    Rachel Crochet, PhD

## 2021-11-09 ENCOUNTER — Ambulatory Visit (INDEPENDENT_AMBULATORY_CARE_PROVIDER_SITE_OTHER): Payer: Federal, State, Local not specified - PPO | Admitting: Psychology

## 2021-11-09 DIAGNOSIS — F3341 Major depressive disorder, recurrent, in partial remission: Secondary | ICD-10-CM

## 2021-11-09 NOTE — Progress Notes (Signed)
PROGRESS NOTE  Name: Rachel [name] Date: 11/09/2021 MRN: [id number] DOB: [birth year] PCP: Donella Stade, PA-C  Time spent: 2:00 - 2:55 PM  Today I met with  Rachel [name] and Rachel [name] in remote video (WebEx) face-to-face individual psychotherapy.  Distance Site: Client's Home Orginating Site: Dr Jannifer Franklin Remote Office Consent: Obtained verbal consent to transmit  session remotely    Reason for Visit /Presenting Problem:  Pt has previously been in individual and couple's therapy with this therapist.  Rachel [name] states that they are having difficulties with their adult son who moved back in.  Their son Rachel [name] has ADHD and he hasn't been complying with their ground rules.  They have been getting into frequent arguments and want some assistance in navigating the situation and to work on their communication.  Rachel [name] is also learning to negotiate work life, her husband's retirement and the changes in their relationship dynamics.  Rachel [name] have been married for 31 years.  They met in high school.  Rachel was her brother's best friend in high school.  Yesly always had an eye on him but because he was her brother's friend he never thought of her that way.  Rachel was in the service in [place] and Rachel [name] was living in [place].  After several years post graduation, he finally asked her out.  Six months later they got married.  They actually dated for two weeks the rest was long distance.    [name] "[name]" (51)  she has a 40 year old baby Rachel [name].  [name] is working full time at [employer] She is no longer with the baby's father but she is co-parenting with Rachel [name].  She is engaged to Rachel [name] (50) it was a problem initially but they are adjusting.  Rachel [name] (51) he received his degree in biology at [employer]. He is now doing IT work Multimedia programmer for [employer].  He was living with his girlfriend and they were very serious.  Last year in December, he became concerned about certain  behavior and they separated.  Now he is see her occasionally but they are uncertain where the relationship is going.   Individualized Treatment Plan                Strengths: bright, verbal, resourceful and motivated  Supports: spouse, adult children and friends   Goal/Needs for Treatment:  In order of importance to patient 1) Learn and implement communication and problem solving skills  2) Learn and implement skills to understand and resolve current interpersonal problems in order to develop healthy interpersonal relationships that lead to the alleviation and help prevent the relapse of depression. 3) Learn and implement conflict management skills to facilitate a more rewarding relationship with adult children.   Client Statement of Needs: seeks assistance in improving her relationship with her adult son, improve communication and learn how to resolve conflict   Treatment Level: Outpatient Individual Psychotherapy  Symptoms: Rachel [name] c/o that she is struggling to focus and concentrate, decreased motivation, she procrastinates and finds it hard to get somethings started. Sets high expectations for herself and she feels like she is failing herself and it makes her feel bad (related to her weight) and she feels like she is more slowed down (more due to the pain in her knee). Endorsed worrying about different things - everyday things such as getting a job. She worries some about needing to work because Rachel Rachel [name] be retiring soon.    Client Treatment Preferences: restart  therapy with previous therapist   Healthcare consumer's goal for treatment:  Psychologist, [name], Ph.D. Rachel [name] support the patient's ability to achieve the goals identified. Cognitive Behavioral Therapy, Dialectical Behavioral Therapy, Motivational Interviewing, Behavior Activation and other evidenced-based practices Rachel [name] be used to promote progress towards healthy functioning.   Healthcare consumer Rachel [name] Rachel [name]:  Actively participate in therapy, working towards healthy functioning.    *Justification for Continuation/Discontinuation of Goal: R=Revised, O=Ongoing, A=Achieved, D=Discontinued  Goal 1) Learn and implement communication and problem solving skills    5 Point Likert rating baseline date: 10/28/2021 Target Date Goal Was reviewed Status Code Progress towards goal/Likert rating  10/29/2022           O              Goal 2) Learn and implement skills to understand and resolve current interpersonal problems in order to develop  healthy interpersonal relationships that lead to the alleviation and help prevent the relapse of depression.   5 Point Likert rating baseline date: 10/28/2021 Target Date Goal Was reviewed Status Code Progress towards goal/Likert rating  10/29/2022           O              Goal 3) Learn and implement conflict management skills to facilitate a more rewarding relationship with adult children.  5 Point Likert rating baseline date: 10/28/2021 Target Date Goal Was reviewed Status Code Progress towards goal/Likert rating  10/29/2022           O              This plan has been reviewed and created by the following participants:  This plan Rachel [name] be reviewed at least every 12 months. Date Behavioral Health Clinician Date Guardian/Patient   10/28/2021 [name], Ph.D.   10/28/2021 Rachel [name]                     Diagnoses:  No diagnosis found.  Today we had a family session with Rachel [name] and her husband Rachel.  We processed the issues that arose during our session with their son.  [name] states that their son came by the house to f/u on the conversation they began in session.  She reports that they were able to have a productive conversation and that her son was able to reveal some difficult truths about himself.  I noted that they made good use of their communication skills, demonstrated that they could be open and listen mindfully without judgment.   [name],  PhD

## 2021-11-16 ENCOUNTER — Ambulatory Visit: Payer: Federal, State, Local not specified - PPO | Admitting: Psychology

## 2021-11-30 ENCOUNTER — Ambulatory Visit: Payer: Federal, State, Local not specified - PPO | Admitting: Obstetrics and Gynecology

## 2021-12-07 ENCOUNTER — Ambulatory Visit (INDEPENDENT_AMBULATORY_CARE_PROVIDER_SITE_OTHER): Payer: Federal, State, Local not specified - PPO | Admitting: Psychology

## 2021-12-07 DIAGNOSIS — F3341 Major depressive disorder, recurrent, in partial remission: Secondary | ICD-10-CM | POA: Diagnosis not present

## 2021-12-07 NOTE — Progress Notes (Signed)
PROGRESS NOTE  Name: Rachel [name] Date: 12/07/2021 MRN: [id number] DOB: 08-Apr-1971 PCP: Donella Stade, PA-C  Time spent: 2:00 - 2:55 PM  Today I met with  [name] and [name] in remote video (WebEx) face-to-face individual psychotherapy.  Distance Site: Client's Home Orginating Site: Dr Jannifer Franklin Remote Office Consent: Obtained verbal consent to transmit  session remotely    Reason for Visit /Presenting Problem:  Pt has previously been in individual and couple's therapy with this therapist.  Kloi states that they are having difficulties with their adult son who moved back in.  Their son Rachel [name] has ADHD and he hasn't been complying with their ground rules.  They have been getting into frequent arguments and want some assistance in navigating the situation and to work on their communication.  Farrah is also learning to negotiate work life, her husband's retirement and the changes in their relationship dynamics.  Sesilia and Norm have been married for 31 years.  They met in high school.  Norm was her brother's best friend in high school.  Lurena always had an eye on him but because he was her brother's friend he never thought of her that way.  Norm was in the service in [place] and Marilla was living in [place].  After several years post graduation, he finally asked her out.  Six months later they got married.  They actually dated for two weeks the rest was long distance.    [name] "Rachel [name]" (27)  she has a 3 year old baby Rachel [name].  Rachel [name] is working full time at [employer] She is no longer with the baby's father but she is co-parenting with Will.  She is engaged to Rachel [name] (51) it was a problem initially but they are adjusting.  Rachel [name] (69) he received his degree in biology at [employer]. He is now doing IT work Multimedia programmer for [employer].  He was living with his girlfriend and they were very serious.  Last year in December, he became concerned about certain  behavior and they separated.  Now he is see her occasionally but they are uncertain where the relationship is going.   Individualized Treatment Plan                Strengths: bright, verbal, resourceful and motivated  Supports: spouse, adult children and friends   Goal/Needs for Treatment:  In order of importance to patient 1) Learn and implement communication and problem solving skills  2) Learn and implement skills to understand and resolve current interpersonal problems in order to develop healthy interpersonal relationships that lead to the alleviation and help prevent the relapse of depression. 3) Learn and implement conflict management skills to facilitate a more rewarding relationship with adult children.   Client Statement of Needs: seeks assistance in improving her relationship with her adult son, improve communication and learn how to resolve conflict   Treatment Level: Outpatient Individual Psychotherapy  Symptoms: Chyenne c/o that she is struggling to focus and concentrate, decreased motivation, she procrastinates and finds it hard to get somethings started. Sets high expectations for herself and she feels like she is failing herself and it makes her feel bad (related to her weight) and she feels like she is more slowed down (more due to the pain in her knee). Endorsed worrying about different things - everyday things such as getting a job. She worries some about needing to work because Norm will be retiring soon.    Client Treatment Preferences: restart  therapy with previous therapist   Healthcare consumer's goal for treatment:  Psychologist, [name], Ph.D. will support the patient's ability to achieve the goals identified. Cognitive Behavioral Therapy, Dialectical Behavioral Therapy, Motivational Interviewing, Behavior Activation and other evidenced-based practices will be used to promote progress towards healthy functioning.   Healthcare consumer [name] will:  Actively participate in therapy, working towards healthy functioning.    *Justification for Continuation/Discontinuation of Goal: R=Revised, O=Ongoing, A=Achieved, D=Discontinued  Goal 1) Learn and implement communication and problem solving skills    5 Point Likert rating baseline date: 10/28/2021 Target Date Goal Was reviewed Status Code Progress towards goal/Likert rating  10/29/2022           O              Goal 2) Learn and implement skills to understand and resolve current interpersonal problems in order to develop  healthy interpersonal relationships that lead to the alleviation and help prevent the relapse of depression.   5 Point Likert rating baseline date: 10/28/2021 Target Date Goal Was reviewed Status Code Progress towards goal/Likert rating  10/29/2022           O              Goal 3) Learn and implement conflict management skills to facilitate a more rewarding relationship with adult children.  5 Point Likert rating baseline date: 10/28/2021 Target Date Goal Was reviewed Status Code Progress towards goal/Likert rating  10/29/2022           O              This plan has been reviewed and created by the following participants:  This plan will be reviewed at least every 12 months. Date Behavioral Health Clinician Date Guardian/Patient   10/28/2021 [name], Ph.D.   10/28/2021 [name]                     Diagnoses:  Depression, major, recurrent, in partial remission [facility])  Today we had a family session with Rachel [name] and her husband Norm.  Rachel [name] is back from being out of the country to visit her father who had been ill.  Her husband states that he has been noticing changes in Rachel [name] for awhile.  Rosaura states that since he's been retired they have had some struggles negotiating time together and time to themselves.  They had an argument yesterday and it left Norm feeling like they were "Two steps back."   We d/e/p what occurred where their communication could have  been more effective and how to better manage their negative assumptions.      [name], PhD    Riki Rusk Mickel Baas Norm

## 2021-12-21 ENCOUNTER — Ambulatory Visit (INDEPENDENT_AMBULATORY_CARE_PROVIDER_SITE_OTHER): Payer: Federal, State, Local not specified - PPO | Admitting: Psychology

## 2021-12-21 ENCOUNTER — Ambulatory Visit: Payer: Federal, State, Local not specified - PPO | Admitting: Obstetrics & Gynecology

## 2021-12-21 ENCOUNTER — Encounter: Payer: Self-pay | Admitting: Obstetrics & Gynecology

## 2021-12-21 VITALS — BP 120/74 | HR 66 | Ht 67.0 in | Wt 247.0 lb

## 2021-12-21 DIAGNOSIS — R9389 Abnormal findings on diagnostic imaging of other specified body structures: Secondary | ICD-10-CM | POA: Diagnosis not present

## 2021-12-21 DIAGNOSIS — N950 Postmenopausal bleeding: Secondary | ICD-10-CM

## 2021-12-21 DIAGNOSIS — F3341 Major depressive disorder, recurrent, in partial remission: Secondary | ICD-10-CM

## 2021-12-21 MED ORDER — MEDROXYPROGESTERONE ACETATE 10 MG PO TABS: 10.0000 mg | ORAL_TABLET | Freq: Every day | ORAL | 0 refills | Status: DC

## 2021-12-21 NOTE — Progress Notes (Signed)
   Subjective:    Patient ID: Rachel [name], female    DOB: 05/12/1970, 51 y.o.   MRN: [id number]  HPI  51 year old female presents after endometrial biopsy results.  Patient takes Maquet root and a progesterone cream from [employer].  As outlined in Dr. Jinny Sanders note, the patient had been in menopause for 3 years.  The Maquet root helps with her moods.  Transvaginal ultrasound showed a 10 mm lining and endometrial cyst.  The endometrial biopsy shows disordered proliferative endometrium, negative for hyperplasia or malignancy polyp and endometritis.  Review of Systems  Constitutional: Negative.   Respiratory: Negative.    Cardiovascular: Negative.   Gastrointestinal: Negative.   Genitourinary: Negative.        Objective:   Physical Exam Vitals reviewed.  Constitutional:      General: She is not in acute distress.    Appearance: She is well-developed.  HENT:     Head: Normocephalic and atraumatic.  Eyes:     Conjunctiva/sclera: Conjunctivae normal.  Cardiovascular:     Rate and Rhythm: Normal rate.  Pulmonary:     Effort: Pulmonary effort is normal.  Skin:    General: Skin is warm and dry.  Neurological:     Mental Status: She is alert and oriented to person, place, and time.  Psychiatric:        Mood and Affect: Mood normal.    Vitals:   12/21/21 1604  BP: 120/74  Pulse: 66  Weight: 247 lb (112 kg)  Height: 5\' 7"  (1.702 m)      Assessment & Plan:  51 year old female G2 P2-0-0-2 female with postmenopausal bleeding and thickened endometrium Biopsy reviewed. Provera for 14 days.  After she stops the Provera, the patient will stop her progesterone cream.  Hopefully she will have a bleed to thin down her lining.  She can restart her progesterone cream if she desires. Patient understands to track her vaginal bleeding.  If she continues to have spotting and bleeding we would continue the work-up with a possible hysteroscopy.

## 2021-12-21 NOTE — Progress Notes (Signed)
PROGRESS NOTE  Name: Jaylnn Ullery Date: 12/21/2021 MRN: [id number] DOB: July 29, 1970 PCP: Donella Stade, PA-C  Time spent: 2:00 - 2:55 PM  Today I met with  [name] and [name] in remote video (WebEx) face-to-face individual psychotherapy.  Distance Site: Client's Home Orginating Site: Dr Jannifer Franklin Remote Office Consent: Obtained verbal consent to transmit  session remotely    Reason for Visit /Presenting Problem:  Pt has previously been in individual and couple's therapy with this therapist.  Frannie states that they are having difficulties with their adult son who moved back in.  Their son [name] has ADHD and he hasn't been complying with their ground rules.  They have been getting into frequent arguments and want some assistance in navigating the situation and to work on their communication.  Maiana is also learning to negotiate work life, her husband's retirement and the changes in their relationship dynamics.  Brytney and Norm have been married for 31 years.  They met in high school.  Norm was her brother's best friend in high school.  Geanine always had an eye on him but because he was her brother's friend he never thought of her that way.  Norm was in the service in [place] and Kamri was living in [place].  After several years post graduation, he finally asked her out.  Six months later they got married.  They actually dated for two weeks the rest was long distance.    [name] "[name]" (51)  she has a 71 year old baby [name].  [name] is working full time at [employer] She is no longer with the baby's father but she is co-parenting with Will.  She is engaged to [name] (51) it was a problem initially but they are adjusting.  [name] (29) he received his degree in biology at [employer] He is now doing IT work Multimedia programmer for [employer].  He was living with his girlfriend and they were very serious.  Last year in December, he became concerned about certain  behavior and they separated.  Now he is see her occasionally but they are uncertain where the relationship is going.   Individualized Treatment Plan                Strengths: bright, verbal, resourceful and motivated  Supports: spouse, adult children and friends   Goal/Needs for Treatment:  In order of importance to patient 1) Learn and implement communication and problem solving skills  2) Learn and implement skills to understand and resolve current interpersonal problems in order to develop healthy interpersonal relationships that lead to the alleviation and help prevent the relapse of depression. 3) Learn and implement conflict management skills to facilitate a more rewarding relationship with adult children.   Client Statement of Needs: seeks assistance in improving her relationship with her adult son, improve communication and learn how to resolve conflict   Treatment Level: Outpatient Individual Psychotherapy  Symptoms: Sharmin c/o that she is struggling to focus and concentrate, decreased motivation, she procrastinates and finds it hard to get somethings started. Sets high expectations for herself and she feels like she is failing herself and it makes her feel bad (related to her weight) and she feels like she is more slowed down (more due to the pain in her knee). Endorsed worrying about different things - everyday things such as getting a job. She worries some about needing to work because Norm will be retiring soon.    Client Treatment Preferences: restart  therapy with previous therapist   Healthcare consumer's goal for treatment:  Psychologist, [name], Ph.D. will support the patient's ability to achieve the goals identified. Cognitive Behavioral Therapy, Dialectical Behavioral Therapy, Motivational Interviewing, Behavior Activation and other evidenced-based practices will be used to promote progress towards healthy functioning.   Healthcare consumer Melonee Gerstel will:  Actively participate in therapy, working towards healthy functioning.    *Justification for Continuation/Discontinuation of Goal: R=Revised, O=Ongoing, A=Achieved, D=Discontinued  Goal 1) Learn and implement communication and problem solving skills    5 Point Likert rating baseline date: 10/28/2021 Target Date Goal Was reviewed Status Code Progress towards goal/Likert rating  10/29/2022           O              Goal 2) Learn and implement skills to understand and resolve current interpersonal problems in order to develop  healthy interpersonal relationships that lead to the alleviation and help prevent the relapse of depression.   5 Point Likert rating baseline date: 10/28/2021 Target Date Goal Was reviewed Status Code Progress towards goal/Likert rating  10/29/2022           O              Goal 3) Learn and implement conflict management skills to facilitate a more rewarding relationship with adult children.  5 Point Likert rating baseline date: 10/28/2021 Target Date Goal Was reviewed Status Code Progress towards goal/Likert rating  10/29/2022           O              This plan has been reviewed and created by the following participants:  This plan will be reviewed at least every 12 months. Date Behavioral Health Clinician Date Guardian/Patient   10/28/2021 [name], Ph.D.   10/28/2021 [name]                     Diagnoses:  Depression, major, recurrent, in partial remission [facility])  Today we had a family session with Kandie and her husband Norm.  Gustavia and Norm state that they continued to d/ after our last session.  They shared how their conversation went and what they agreed to do.  But as we d/ further I noted that they weren't specific enough about HOW to approach one another with prickly topics.  I walked them through a few situations that have been challenging in the past.  I used psycho education, modeling and positive feedback to reinforce new  behaviors.    [name], PhD    Riki Rusk Mickel Baas Norm

## 2022-01-04 ENCOUNTER — Other Ambulatory Visit: Payer: Self-pay | Admitting: Obstetrics & Gynecology

## 2022-01-11 ENCOUNTER — Telehealth (INDEPENDENT_AMBULATORY_CARE_PROVIDER_SITE_OTHER): Payer: Federal, State, Local not specified - PPO | Admitting: Obstetrics & Gynecology

## 2022-01-11 ENCOUNTER — Encounter: Payer: Self-pay | Admitting: Obstetrics & Gynecology

## 2022-01-11 ENCOUNTER — Telehealth: Payer: Self-pay | Admitting: *Deleted

## 2022-01-11 DIAGNOSIS — K080 Exfoliation of teeth due to systemic causes: Secondary | ICD-10-CM | POA: Diagnosis not present

## 2022-01-11 DIAGNOSIS — R4586 Emotional lability: Secondary | ICD-10-CM

## 2022-01-11 DIAGNOSIS — R9389 Abnormal findings on diagnostic imaging of other specified body structures: Secondary | ICD-10-CM

## 2022-01-11 DIAGNOSIS — N950 Postmenopausal bleeding: Secondary | ICD-10-CM | POA: Diagnosis not present

## 2022-01-11 NOTE — Telephone Encounter (Signed)
Patient was advised to pay co-pay prior to getting on MyChart visit. Patient staed that she thought she would get billed. Patient advised that co-pay is due at time of service. Patient agreed and disconnected to join virtual visit.

## 2022-01-11 NOTE — Progress Notes (Signed)
TELEHEALTH GYNECOLOGY VISIT ENCOUNTER NOTE  Provider location: [facility] for [facility] [place]   Patient location: Home  I connected with Rachel [name] on 01/11/22 at  4:10 PM EDT by telephone and verified that I am speaking with the correct person using two identifiers. Patient was unable to do MyChart audiovisual encounter due to technical difficulties, she tried several times.    I discussed the limitations, risks, security and privacy concerns of performing an evaluation and management service by telephone and the availability of in person appointments. I also discussed with the patient that there may be a patient responsible charge related to this service. The patient expressed understanding and agreed to proceed.   History:  Rachel [name] is a 51 y.o. [id number] female being evaluated today to discuss hormones.  Patient was on a Maquet root and a compounded progesterone cream.  She was using this to help her with menopausal symptoms.  She was having extreme mental fog while taking this medication.  She had some postmenopausal bleeding and underwent endometrial biopsy that was benign as well as an ultrasound which showed a thickened endometrium.  On September 11 I prescribed her Provera for 14 days to see if this would stimulate bleeding and thinning her lining.  She stopped her Maquet root and progesterone cream.  Her mental fog lifted.  She was wondering if it was the absence of her medications and creams or was that the addition of Provera.  Patient is now off Provera and still feels very well.  She did bleed some after the Provera stopped.     Past Medical History:  Diagnosis Date   Asthma    childhood   Obesity    Seasonal allergies    Past Surgical History:  Procedure Laterality Date   TONSILLECTOMY     WISDOM TOOTH EXTRACTION     The following portions of the patient's history were reviewed and updated as appropriate: allergies, current medications, past family  history, past medical history, past social history, past surgical history and problem list.    Review of Systems:  Pertinent items noted in HPI and remainder of comprehensive ROS otherwise negative.  Physical Exam:   General:  Alert, oriented and cooperative.   Mental Status: Normal mood and affect perceived. Normal judgment and thought content.  Physical exam deferred due to nature of the encounter  Labs and Imaging No results found for this or any previous visit (from the past 336 hour(s)). No results found.    Assessment and Plan:     51 year old female with postmenopausal bleeding and thickened endometrium.  As described above patient had Provera and then had some bleeding after Provera.  She is stopped her homeopathic medications and has lifting of her mental fog.  She was inquiring whether she could stay on Provera long-term.  I encouraged her to not have any hormones for a while and see if she stays symptom-free.  Progesterone can be associated with increased risk of breast cancer.  Patient is aligned with this plan and will contact me later if things recur.      I discussed the assessment and treatment plan with the patient. The patient was provided an opportunity to ask questions and all were answered. The patient agreed with the plan and demonstrated an understanding of the instructions.   The patient was advised to call back or seek an in-person evaluation/go to the ED if the symptoms worsen or if the condition fails to improve as anticipated.  This  25 minutes was spent during this encounter both verbal and nonverbal.  This included review of records, the cnounter, counseling, and documentation.   Silas Sacramento, MD [facility] for [facility], [place]

## 2022-01-22 ENCOUNTER — Encounter: Payer: Self-pay | Admitting: Family Medicine

## 2022-01-29 ENCOUNTER — Encounter: Payer: Self-pay | Admitting: Physician Assistant

## 2022-01-29 MED ORDER — ALBUTEROL SULFATE HFA 108 (90 BASE) MCG/ACT IN AERS: 1.0000 | INHALATION_SPRAY | Freq: Four times a day (QID) | RESPIRATORY_TRACT | 0 refills | Status: DC | PRN

## 2022-01-29 MED ORDER — MONTELUKAST SODIUM 10 MG PO TABS: 10.0000 mg | ORAL_TABLET | Freq: Every day | ORAL | 0 refills | Status: DC

## 2022-01-29 MED ORDER — ALBUTEROL SULFATE HFA 108 (90 BASE) MCG/ACT IN AERS: 1.0000 | INHALATION_SPRAY | Freq: Four times a day (QID) | RESPIRATORY_TRACT | 0 refills | Status: AC | PRN

## 2022-01-29 NOTE — Addendum Note (Signed)
Addended by: Donella Stade on: 01/29/2022 04:24 PM   Modules accepted: Orders

## 2022-03-13 DIAGNOSIS — J069 Acute upper respiratory infection, unspecified: Secondary | ICD-10-CM | POA: Diagnosis not present

## 2022-03-13 DIAGNOSIS — H6693 Otitis media, unspecified, bilateral: Secondary | ICD-10-CM | POA: Diagnosis not present

## 2022-06-09 DIAGNOSIS — Z1339 Encounter for screening examination for other mental health and behavioral disorders: Secondary | ICD-10-CM | POA: Diagnosis not present

## 2022-06-09 DIAGNOSIS — R109 Unspecified abdominal pain: Secondary | ICD-10-CM | POA: Diagnosis not present

## 2022-06-09 DIAGNOSIS — F902 Attention-deficit hyperactivity disorder, combined type: Secondary | ICD-10-CM | POA: Diagnosis not present

## 2022-06-09 DIAGNOSIS — R1011 Right upper quadrant pain: Secondary | ICD-10-CM | POA: Diagnosis not present

## 2022-06-09 DIAGNOSIS — R1031 Right lower quadrant pain: Secondary | ICD-10-CM | POA: Diagnosis not present

## 2022-06-09 DIAGNOSIS — F411 Generalized anxiety disorder: Secondary | ICD-10-CM | POA: Diagnosis not present

## 2022-07-07 DIAGNOSIS — M9902 Segmental and somatic dysfunction of thoracic region: Secondary | ICD-10-CM | POA: Diagnosis not present

## 2022-07-07 DIAGNOSIS — M546 Pain in thoracic spine: Secondary | ICD-10-CM | POA: Diagnosis not present

## 2022-07-07 DIAGNOSIS — M9903 Segmental and somatic dysfunction of lumbar region: Secondary | ICD-10-CM | POA: Diagnosis not present

## 2022-07-07 DIAGNOSIS — M5451 Vertebrogenic low back pain: Secondary | ICD-10-CM | POA: Diagnosis not present

## 2022-07-08 DIAGNOSIS — M546 Pain in thoracic spine: Secondary | ICD-10-CM | POA: Diagnosis not present

## 2022-07-08 DIAGNOSIS — M5451 Vertebrogenic low back pain: Secondary | ICD-10-CM | POA: Diagnosis not present

## 2022-07-08 DIAGNOSIS — M9903 Segmental and somatic dysfunction of lumbar region: Secondary | ICD-10-CM | POA: Diagnosis not present

## 2022-07-08 DIAGNOSIS — M9902 Segmental and somatic dysfunction of thoracic region: Secondary | ICD-10-CM | POA: Diagnosis not present

## 2022-07-12 DIAGNOSIS — M9903 Segmental and somatic dysfunction of lumbar region: Secondary | ICD-10-CM | POA: Diagnosis not present

## 2022-07-12 DIAGNOSIS — M546 Pain in thoracic spine: Secondary | ICD-10-CM | POA: Diagnosis not present

## 2022-07-12 DIAGNOSIS — M9902 Segmental and somatic dysfunction of thoracic region: Secondary | ICD-10-CM | POA: Diagnosis not present

## 2022-07-12 DIAGNOSIS — M5451 Vertebrogenic low back pain: Secondary | ICD-10-CM | POA: Diagnosis not present

## 2022-07-14 DIAGNOSIS — M546 Pain in thoracic spine: Secondary | ICD-10-CM | POA: Diagnosis not present

## 2022-07-14 DIAGNOSIS — M9902 Segmental and somatic dysfunction of thoracic region: Secondary | ICD-10-CM | POA: Diagnosis not present

## 2022-07-14 DIAGNOSIS — M9903 Segmental and somatic dysfunction of lumbar region: Secondary | ICD-10-CM | POA: Diagnosis not present

## 2022-07-14 DIAGNOSIS — M5451 Vertebrogenic low back pain: Secondary | ICD-10-CM | POA: Diagnosis not present

## 2022-07-20 DIAGNOSIS — M9902 Segmental and somatic dysfunction of thoracic region: Secondary | ICD-10-CM | POA: Diagnosis not present

## 2022-07-20 DIAGNOSIS — M546 Pain in thoracic spine: Secondary | ICD-10-CM | POA: Diagnosis not present

## 2022-07-20 DIAGNOSIS — M9903 Segmental and somatic dysfunction of lumbar region: Secondary | ICD-10-CM | POA: Diagnosis not present

## 2022-07-20 DIAGNOSIS — M5451 Vertebrogenic low back pain: Secondary | ICD-10-CM | POA: Diagnosis not present

## 2022-07-26 ENCOUNTER — Encounter: Payer: Self-pay | Admitting: *Deleted

## 2022-08-11 DIAGNOSIS — F411 Generalized anxiety disorder: Secondary | ICD-10-CM | POA: Diagnosis not present

## 2022-08-11 DIAGNOSIS — F902 Attention-deficit hyperactivity disorder, combined type: Secondary | ICD-10-CM | POA: Diagnosis not present

## 2022-11-11 ENCOUNTER — Ambulatory Visit (INDEPENDENT_AMBULATORY_CARE_PROVIDER_SITE_OTHER): Payer: Federal, State, Local not specified - PPO | Admitting: Psychology

## 2022-11-11 DIAGNOSIS — F4323 Adjustment disorder with mixed anxiety and depressed mood: Secondary | ICD-10-CM

## 2022-11-11 NOTE — Progress Notes (Signed)
[facility] Initial Adult Intake  Name: Rachel [name] Date: 11/11/2022 MRN: [id number] DOB: [birth year] PCP: Jomarie Longs, PA-C  Time spent: 10:02 am - 11:00 am  Guardian/Payee:  Self  Paperwork requested: Yes   Today I met with Rachel [name] for in office face-to-face individual psychotherapy.  Reason for Visit /Presenting Problem: Rachel [name] is a 52 y.o. MHF who returns to treatment following the unexpected death of her mother.  She is aware that she is struggling to cope with the sudden loss of her mother.  Rachel [name] has previously been in treatment with this therapist in both individual and couple's therapy.  She has a history of depression, anxiety and adult ADHD.  Rachel [name] and her husband Norm (54) have been married for 33 years.  They met in high school.  Norm was her brother's best friend in high school.  Rachel [name] always had an eye on him but because he was her brother's friend, she thought he would never think of her that way.  Norm was in the service in [place] and Rachel [name] was living in [place].  After several years post graduation, he finally asked her out.  Six months later they got married.  They actually only dated for two weeks and the rest of their courtship was long distance.  Both Rachel [name] and Norm are [place].  Norm was born in the [place] and lived in [place] between the ages of 1-19.  All his family is in [place].   Mental Status Exam: Appearance:   Casual     Behavior:  Appropriate  Motor:  Normal  Speech/Language:   NA  Affect:  Appropriate and Tearful  Mood:  sad  Thought process:  normal  Thought content:    WNL  Sensory/Perceptual disturbances:    WNL  Orientation:  oriented to person, place, time/date, and situation  Attention:  NA  Concentration:  Good  Memory:  WNL  Fund of knowledge:   Good  Insight:    Good  Judgment:   Good  Impulse Control:  Good    Reported Symptoms:  sad mood, pain in her chest, crying, fatigue  Risk  Assessment: Danger to Self:  No Self-injurious Behavior: No Danger to Others: No Duty to Warn:no Physical Aggression / Violence:No  Access to Firearms a concern: No  Gang Involvement:No  Substance Abuse History: Current substance abuse: No     Past Psychiatric History:   Previous psychological history is significant for ADHD, anxiety, and depression Outpatient Providers: previous episodes of therapy with this therapist History of Psych Hospitalization: No  Psychological Testing: Attention/ADHD:  unknown    Abuse History:  Victim of: No.,  n/a    Report needed: No. Victim of NeglectNo. Perpetrator of  n/a   Witness / Exposure to Domestic Violence: No   Protective Services Involvement: No  Witness to [employer] Violence:  No   Family History:  Family History  Problem Relation Age of Onset   Healthy Mother    Hypertension Father    Diabetes Maternal Aunt    Heart attack Maternal Uncle    Heart attack Maternal Grandfather    Pancreatic cancer Paternal Grandfather    Colon cancer Neg Hx    Esophageal cancer Neg Hx    Rectal cancer Neg Hx    Stomach cancer Neg Hx    Colon polyps Neg Hx     Living situation: the patient lives with their family  Sexual Orientation: Straight  Relationship Status:  married  Name of spouse / other: Norm, see above If a parent, number of children / ages: 2   Support Systems: spouse friends  Surveyor, quantity Stress:  No   Income/Employment/Disability: Employment  Financial planner: No   Educational History: Education: Water quality scientist: Protestant  Any cultural differences that may affect / interfere with treatment:  not applicable   Recreation/Hobbies: gardening  Stressors: Traumatic event    Strengths: Supportive Relationships, Family, Friends, Warehouse manager, Spirituality, Journalist, newspaper, and Able to Communicate Effectively  Barriers:  None   Legal History: Pending legal issue / charges: The patient has  no significant history of legal issues. History of legal issue / charges:  n/a  Medical History/Surgical History: reviewed Past Medical History:  Diagnosis Date   Asthma    childhood   Obesity    Seasonal allergies     Past Surgical History:  Procedure Laterality Date   TONSILLECTOMY     WISDOM TOOTH EXTRACTION      Medications: Current Outpatient Medications  Medication Sig Dispense Refill   albuterol (VENTOLIN HFA) 108 (90 Base) MCG/ACT inhaler Inhale 1-2 puffs into the lungs every 6 (six) hours as needed for wheezing or shortness of breath. 8 g 0   diclofenac Sodium (VOLTAREN) 1 % GEL Apply 4 g topically 4 (four) times daily. To affected joint. 100 g 5   fexofenadine (ALLEGRA) 180 MG tablet Take 180 mg by mouth daily.     fluticasone (FLONASE) 50 MCG/ACT nasal spray Place into both nostrils daily.     montelukast (SINGULAIR) 10 MG tablet Take 1 tablet (10 mg total) by mouth at bedtime. 90 tablet 0   VITAMIN D PO Take 1 tablet by mouth daily at 2 am.     VITAMIN K PO Take 2 capsules by mouth daily at 6 (six) AM.     No current facility-administered medications for this visit.    Allergies  Allergen Reactions   Augmentin [Amoxicillin-Pot Clavulanate] Nausea Only    GI symptoms    Diagnoses:  Adjustment disorder with mixed anxiety and depressed mood  Plan of Care: Rachel [name] is a 52 y.o. MHF who returns to treatment following the unexpected death of her mother.  It is believed that Rachel [name] would benefit from a weekly individual psychotherapy which would focus on providing support around issues related to the sudden and unexpected loss of her mother. Treatment would work to build positive coping strategies and skills which would allow patient to grieve and mourn her mother's loss while managing her mood states in an effort to prevent a relapse of her depression.   Therapist will continue to monitor for changes in mood. Therapist will continue to monitor need for  medication and follow up with PCP for medication if necessary.  Hilma Favors, PhD

## 2022-11-17 ENCOUNTER — Other Ambulatory Visit: Payer: Self-pay | Admitting: Obstetrics and Gynecology

## 2022-11-17 ENCOUNTER — Ambulatory Visit: Payer: Federal, State, Local not specified - PPO | Admitting: Psychology

## 2022-11-17 DIAGNOSIS — F4323 Adjustment disorder with mixed anxiety and depressed mood: Secondary | ICD-10-CM | POA: Diagnosis not present

## 2022-11-17 DIAGNOSIS — Z1231 Encounter for screening mammogram for malignant neoplasm of breast: Secondary | ICD-10-CM

## 2022-11-17 NOTE — Progress Notes (Signed)
PROGRESS NOTE:  Name: Rachel [name] Date: 11/17/2022 MRN: [id number] DOB: July 22, 1970 PCP: Jomarie Longs, PA-C  Time spent: 5:02 PM - 5:00 PM  Guardian/Payee:  Self  Paperwork requested: Yes   Today I met with [name] for in office face-to-face individual psychotherapy.  Reason for Visit /Presenting Problem: Rachel [name] is a 52 y.o. MHF who returns to treatment following the unexpected death of her mother.  She is aware that she is struggling to cope with the sudden loss of her mother.  Zeinab has previously been in treatment with this therapist in both individual and couple's therapy.  She has a history of depression, anxiety and adult ADHD.  Rachel [name] and her husband Rachel [name] (54) have been married for 33 years.  They met in high school.  Rachel [name] was her brother's best friend in high school.  Damyah always had an eye on him but because he was her brother's friend, she thought he would never think of her that way.  Rachel [name] was in the service in [place] and Harrison was living in [place].  After several years post graduation, he finally asked her out.  Six months later they got married.  They actually only dated for two weeks and the rest of their courtship was long distance.  Both Milanni and Rachel [name] are [place].  Rachel [name] was born in the [place] and lived in [place] between the ages of 1-19.  All his family is in [place].   Mental Status Exam: Appearance:   Casual     Behavior:  Appropriate  Motor:  Normal  Speech/Language:   NA  Affect:  Appropriate and Tearful  Mood:  sad  Thought process:  normal  Thought content:    WNL  Sensory/Perceptual disturbances:    WNL  Orientation:  oriented to person, place, time/date, and situation  Attention:  NA  Concentration:  Good  Memory:  WNL  Fund of knowledge:   Good  Insight:    Good  Judgment:   Good  Impulse Control:  Good    Reported Symptoms:  sad mood, pain in her chest, crying, fatigue  Risk Assessment: Danger to Self:  No Self-injurious  Behavior: No Danger to Others: No Duty to Warn:no Physical Aggression / Violence:No  Access to Firearms a concern: No  Gang Involvement:No  Substance Abuse History: Current substance abuse: No     Past Psychiatric History:   Previous psychological history is significant for ADHD, anxiety, and depression Outpatient Providers: previous episodes of therapy with this therapist History of Psych Hospitalization: No  Psychological Testing: Attention/ADHD:  unknown    Abuse History:  Victim of: No.,  n/a    Report needed: No. Victim of NeglectNo. Perpetrator of  n/a   Witness / Exposure to Domestic Violence: No   Protective Services Involvement: No  Witness to [employer] Violence:  No   Family History:  Family History  Problem Relation Age of Onset   Healthy Mother    Hypertension Father    Diabetes Maternal Aunt    Heart attack Maternal Uncle    Heart attack Maternal Grandfather    Pancreatic cancer Paternal Grandfather    Colon cancer Neg Hx    Esophageal cancer Neg Hx    Rectal cancer Neg Hx    Stomach cancer Neg Hx    Colon polyps Neg Hx     Living situation: the patient lives with their family  Sexual Orientation: Straight  Relationship Status: married  Name of spouse /  other: Rachel [name], see above If a parent, number of children / ages: 2   Support Systems: spouse friends  Surveyor, quantity Stress:  No   Income/Employment/Disability: Employment  Financial planner: No   Educational History: Education: Water quality scientist: Protestant  Any cultural differences that may affect / interfere with treatment:  not applicable   Recreation/Hobbies: gardening  Stressors: Traumatic event    Strengths: Supportive Relationships, Family, Friends, Warehouse manager, Spirituality, Journalist, newspaper, and Able to Communicate Effectively  Barriers:  None   Legal History: Pending legal issue / charges: The patient has no significant history of legal issues. History  of legal issue / charges:  n/a  Medical History/Surgical History: reviewed Past Medical History:  Diagnosis Date   Asthma    childhood   Obesity    Seasonal allergies     Past Surgical History:  Procedure Laterality Date   TONSILLECTOMY     WISDOM TOOTH EXTRACTION      Medications: Current Outpatient Medications  Medication Sig Dispense Refill   albuterol (VENTOLIN HFA) 108 (90 Base) MCG/ACT inhaler Inhale 1-2 puffs into the lungs every 6 (six) hours as needed for wheezing or shortness of breath. 8 g 0   diclofenac Sodium (VOLTAREN) 1 % GEL Apply 4 g topically 4 (four) times daily. To affected joint. 100 g 5   fexofenadine (ALLEGRA) 180 MG tablet Take 180 mg by mouth daily.     fluticasone (FLONASE) 50 MCG/ACT nasal spray Place into both nostrils daily.     montelukast (SINGULAIR) 10 MG tablet Take 1 tablet (10 mg total) by mouth at bedtime. 90 tablet 0   VITAMIN D PO Take 1 tablet by mouth daily at 2 am.     VITAMIN K PO Take 2 capsules by mouth daily at 6 (six) AM.     No current facility-administered medications for this visit.    Allergies  Allergen Reactions   Augmentin [Amoxicillin-Pot Clavulanate] Nausea Only    GI symptoms    Individualized Treatment Plan       Strengths: bright, verbal, willing and open to learn new approaches to cope  Supports: spouse, adult children, sister   Goal/Needs for Treatment:  In order of importance to patient 1) Processing grief and loss over her mother's death 2) Assist in continued adjustment to new phase of life (ie,, retirement, aging parents) 3) Assist with challenges related to  parenting adult children   Client Statement of Needs: Returns to therapy after the death of her mother and requires support and guidance with aging father who lives in another country, around husband's retirement and parenting adult children   Treatment Level: Individual weekly/biweekly outpatient psychotherapy  Symptoms: excessive worry, disrupted  sleep, sadness, irritable, sensory sensitivity  Client Treatment Preferences: Continue with previous therapist   Healthcare consumer's goal for treatment:  Psychologist, Hilma Favors, Ph.D. will support the patient's ability to achieve the goals identified. Cognitive Behavioral Therapy, Dialectical Behavioral Therapy, Motivational Interviewing, Behavior Activation and other evidenced-based practices will be used to promote progress towards healthy functioning.   Healthcare consumer Mashell Faunce will: Actively participate in therapy, working towards healthy functioning.    *Justification for Continuation/Discontinuation of Goal: R=Revised, O=Ongoing, A=Achieved, D=Discontinued  Goal 1) Processing grief and loss over her mother's death  Likert rating baseline date: 11/17/2022 Target Date Goal Was reviewed Status Code Progress towards goal/Likert rating  11/17/2022            O  Goal 2) Assist in continued adjustment to new phase of life (ie,, retirement, aging parents)  Likert rating baseline date: 11/17/2022 Target Date Goal Was reviewed Status Code Progress towards goal/Likert rating  11/17/2022            O              Goal 3) Assist with challenges related to parenting adult children  Likert rating baseline date: 11/17/2022 Target Date Goal Was reviewed Status Code Progress towards goal/Likert rating  11/17/2023            O              This plan has been reviewed and created by the following participants:  This plan will be reviewed at least every 12 months. Date Behavioral Health Clinician Date Guardian/Patient   11/17/2022 Hilma Favors, Ph.D.  11/17/2022 Caryl Comes                    Diagnoses:  Adjustment Disorder with Mixed Depressed and Anxious Mood  In session today, we created Reily's treatment plan.  We d/ her needs and set goals.  Bettyanne actively participated in the creation of her treatment plan and freely gave her consent.     Hilma Favors, PhD

## 2022-11-18 ENCOUNTER — Ambulatory Visit: Payer: Federal, State, Local not specified - PPO

## 2022-11-25 ENCOUNTER — Ambulatory Visit: Payer: Federal, State, Local not specified - PPO | Admitting: Psychology

## 2022-12-08 ENCOUNTER — Ambulatory Visit (INDEPENDENT_AMBULATORY_CARE_PROVIDER_SITE_OTHER): Payer: Federal, State, Local not specified - PPO | Admitting: Psychology

## 2022-12-08 DIAGNOSIS — F4323 Adjustment disorder with mixed anxiety and depressed mood: Secondary | ICD-10-CM

## 2022-12-08 NOTE — Progress Notes (Signed)
PROGRESS NOTE:  Name: Rachel [name] Date: 12/08/2022 MRN: [id number] DOB: 31-Aug-1970 PCP: Jomarie Longs, PA-C  Time spent: :10:02 AM - 10:59 AM  Guardian/Payee:  Self  Paperwork requested: Yes   Today I met with [name] for in office face-to-face individual psychotherapy.  Reason for Visit /Presenting Problem: Rachel [name] is a 52 y.o. MHF who returns to treatment following the unexpected death of her mother.  She is aware that she is struggling to cope with the sudden loss of her mother.  [name] has previously been in treatment with this therapist in both individual and couple's therapy.  She has a history of depression, anxiety and adult ADHD.  Rachel [name] and her husband Norm (54) have been married for 33 years.  They met in high school.  Norm was her brother's best friend in high school.  [name] always had an eye on him but because he was her brother's friend, she thought he would never think of her that way.  Norm was in the service in [place] and [name] was living in [place].  After several years post graduation, he finally asked her out.  Six months later they got married.  They actually only dated for two weeks and the rest of their courtship was long distance.  Both Rachel [name] and Norm are [place].  Norm was born in the [place] and lived in [place] between the ages of 1-19.  All his family is in [place].   Mental Status Exam: Appearance:   Casual     Behavior:  Appropriate  Motor:  Normal  Speech/Language:   NA  Affect:  Appropriate and Tearful  Mood:  sad  Thought process:  normal  Thought content:    WNL  Sensory/Perceptual disturbances:    WNL  Orientation:  oriented to person, place, time/date, and situation  Attention:  NA  Concentration:  Good  Memory:  WNL  Fund of knowledge:   Good  Insight:    Good  Judgment:   Good  Impulse Control:  Good    Reported Symptoms:  sad mood, pain in her chest, crying, fatigue  Risk Assessment: Danger to Self:   No Self-injurious Behavior: No Danger to Others: No Duty to Warn:no Physical Aggression / Violence:No  Access to Firearms a concern: No  Gang Involvement:No  Substance Abuse History: Current substance abuse: No     Past Psychiatric History:   Previous psychological history is significant for ADHD, anxiety, and depression Outpatient Providers: previous episodes of therapy with this therapist History of Psych Hospitalization: No  Psychological Testing: Attention/ADHD:  unknown    Abuse History:  Victim of: No.,  n/a    Report needed: No. Victim of NeglectNo. Perpetrator of  n/a   Witness / Exposure to Domestic Violence: No   Protective Services Involvement: No  Witness to [employer] Violence:  No   Family History:  Family History  Problem Relation Age of Onset   Healthy Mother    Hypertension Father    Diabetes Maternal Aunt    Heart attack Maternal Uncle    Heart attack Maternal Grandfather    Pancreatic cancer Paternal Grandfather    Colon cancer Neg Hx    Esophageal cancer Neg Hx    Rectal cancer Neg Hx    Stomach cancer Neg Hx    Colon polyps Neg Hx     Living situation: the patient lives with their family  Sexual Orientation: Straight  Relationship Status: married  Name of spouse /  other: Norm, see above If a parent, number of children / ages: 2   Support Systems: spouse friends  Surveyor, quantity Stress:  No   Income/Employment/Disability: Employment  Financial planner: No   Educational History: Education: Water quality scientist: Protestant  Any cultural differences that may affect / interfere with treatment:  not applicable   Recreation/Hobbies: gardening  Stressors: Traumatic event    Strengths: Supportive Relationships, Family, Friends, Warehouse manager, Spirituality, Journalist, newspaper, and Able to Communicate Effectively  Barriers:  None   Legal History: Pending legal issue / charges: The patient has no significant history of  legal issues. History of legal issue / charges:  n/a  Medical History/Surgical History: reviewed Past Medical History:  Diagnosis Date   Asthma    childhood   Obesity    Seasonal allergies     Past Surgical History:  Procedure Laterality Date   TONSILLECTOMY     WISDOM TOOTH EXTRACTION      Medications: Current Outpatient Medications  Medication Sig Dispense Refill   albuterol (VENTOLIN HFA) 108 (90 Base) MCG/ACT inhaler Inhale 1-2 puffs into the lungs every 6 (six) hours as needed for wheezing or shortness of breath. 8 g 0   diclofenac Sodium (VOLTAREN) 1 % GEL Apply 4 g topically 4 (four) times daily. To affected joint. 100 g 5   fexofenadine (ALLEGRA) 180 MG tablet Take 180 mg by mouth daily.     fluticasone (FLONASE) 50 MCG/ACT nasal spray Place into both nostrils daily.     montelukast (SINGULAIR) 10 MG tablet Take 1 tablet (10 mg total) by mouth at bedtime. 90 tablet 0   VITAMIN D PO Take 1 tablet by mouth daily at 2 am.     VITAMIN K PO Take 2 capsules by mouth daily at 6 (six) AM.     No current facility-administered medications for this visit.    Allergies  Allergen Reactions   Augmentin [Amoxicillin-Pot Clavulanate] Nausea Only    GI symptoms    Individualized Treatment Plan        Strengths: bright, verbal, willing and open to learn new approaches to cope  Supports: spouse, adult children, sister   Goal/Needs for Treatment:  In order of importance to patient 1) Processing grief and loss over her mother's death 2) Assist in continued adjustment to new phase of life (ie,, retirement, aging parents) 3) Assist with challenges related to  parenting adult children   Client Statement of Needs: Returns to therapy after the death of her mother and requires support and guidance with aging father who lives in another country, around husband's retirement and parenting adult children   Treatment Level: Individual weekly/biweekly outpatient psychotherapy  Symptoms:  excessive worry, disrupted sleep, sadness, irritable, sensory sensitivity  Client Treatment Preferences: Continue with previous therapist   Healthcare consumer's goal for treatment:  Psychologist, Hilma Favors, Ph.D. will support the patient's ability to achieve the goals identified. Cognitive Behavioral Therapy, Dialectical Behavioral Therapy, Motivational Interviewing, Behavior Activation and other evidenced-based practices will be used to promote progress towards healthy functioning.   Healthcare consumer Shauntelle Backlund will: Actively participate in therapy, working towards healthy functioning.    *Justification for Continuation/Discontinuation of Goal: R=Revised, O=Ongoing, A=Achieved, D=Discontinued  Goal 1) Processing grief and loss over her mother's death  Likert rating baseline date: 11/17/2022 Target Date Goal Was reviewed Status Code Progress towards goal/Likert rating  11/17/2022            O  Goal 2) Assist in continued adjustment to new phase of life (ie,, retirement, aging parents)  Likert rating baseline date: 11/17/2022 Target Date Goal Was reviewed Status Code Progress towards goal/Likert rating  11/17/2022            O              Goal 3) Assist with challenges related to parenting adult children  Likert rating baseline date: 11/17/2022 Target Date Goal Was reviewed Status Code Progress towards goal/Likert rating  11/17/2023            O              This plan has been reviewed and created by the following participants:  This plan will be reviewed at least every 12 months. Date Behavioral Health Clinician Date Guardian/Patient   11/17/2022 Hilma Favors, Ph.D.  11/17/2022 Caryl Comes                    Diagnoses:  Adjustment Disorder with Mixed Depressed and Anxious Mood  Devanny states that they had a long night.  She shared a problem her daughter was having with her husband.  Nikki asked her parents for help.  We d/e/p how far they have come in their  relationship with [facility].    Markeisha shared that she and her husband are planning a trip to [place] in October.  She stated that she has been trying to get to the gym to prepare for the hiking they are planning to do, but hasn't yet once gotten to the gym.  We d/p the problem of starting and keeping to routines, and how to get back on track.  I provided psycho education around habit formation, skills and strategies to maintain good habits.   Hilma Favors, PhD

## 2022-12-09 ENCOUNTER — Ambulatory Visit: Payer: Federal, State, Local not specified - PPO | Admitting: Psychology

## 2022-12-20 ENCOUNTER — Ambulatory Visit: Payer: Federal, State, Local not specified - PPO | Admitting: Physician Assistant

## 2022-12-20 ENCOUNTER — Encounter: Payer: Self-pay | Admitting: Physician Assistant

## 2022-12-20 VITALS — BP 135/81 | HR 74 | Ht 67.0 in | Wt 241.0 lb

## 2022-12-20 DIAGNOSIS — Q825 Congenital non-neoplastic nevus: Secondary | ICD-10-CM | POA: Diagnosis not present

## 2022-12-20 DIAGNOSIS — N342 Other urethritis: Secondary | ICD-10-CM | POA: Diagnosis not present

## 2022-12-20 DIAGNOSIS — L821 Other seborrheic keratosis: Secondary | ICD-10-CM | POA: Insufficient documentation

## 2022-12-20 DIAGNOSIS — L989 Disorder of the skin and subcutaneous tissue, unspecified: Secondary | ICD-10-CM | POA: Insufficient documentation

## 2022-12-20 DIAGNOSIS — R300 Dysuria: Secondary | ICD-10-CM

## 2022-12-20 LAB — POCT URINALYSIS DIP (CLINITEK)
Bilirubin, UA: NEGATIVE
Blood, UA: NEGATIVE
Glucose, UA: NEGATIVE mg/dL
Ketones, POC UA: NEGATIVE mg/dL
Leukocytes, UA: NEGATIVE
Nitrite, UA: NEGATIVE
POC PROTEIN,UA: NEGATIVE
Spec Grav, UA: 1.015 (ref 1.010–1.025)
Urobilinogen, UA: 0.2 U/dL
pH, UA: 7 (ref 5.0–8.0)

## 2022-12-20 MED ORDER — TRIAMCINOLONE ACETONIDE 0.1 % EX CREA: 1.0000 | TOPICAL_CREAM | Freq: Two times a day (BID) | CUTANEOUS | 0 refills | Status: DC

## 2022-12-20 NOTE — Progress Notes (Addendum)
Acute Office Visit  Subjective:     Patient ID: Renitta Hemrick, female    DOB: January 24, 1971, 52 y.o.   MRN: [id number]  Chief Complaint  Patient presents with   Dysuria    HPI Patient is in today for 3 days of dysuria, odor, itching. Over the weekend symptoms resolved. No fever, chills, nausea, vomiting, flank pain or abdominal pain.   Pt has irritated raised brown lesion of left cheek that she would like looked at today. Does not hurt or itch.   Rash on right upper leg for the last few months getting bigger. Does not hurt or itch. Not tried anything to make better.   .. Active Ambulatory Problems    Diagnosis Date Noted   Morton's neuroma of left foot 03/10/2015   Metatarsal deformity 03/10/2015   Plantar fasciitis, bilateral 03/10/2015   Pronation deformity of both feet 04/10/2015   Class 2 obesity due to excess calories without serious comorbidity with body mass index (BMI) of 36.0 to 36.9 in adult 08/19/2017   Allergic rhinitis 12/17/2010   Tonsil stone 12/17/2010   Mass of right eye 01/13/2018   Blepharitis of right upper eyelid 01/13/2018   Accessory skin tags 01/13/2018   Abnormal weight gain 06/06/2019   Primary osteoarthritis of both knees 09/05/2019   Seasonal allergies 06/10/2020   Eustachian tube dysfunction, bilateral 06/10/2020   Bilateral hearing loss 06/10/2020   Bilateral ankle pain 03/18/2021   Acute maxillary sinusitis 04/15/2021   Seborrheic keratoses 12/20/2022   Skin lesion 12/20/2022   Resolved Ambulatory Problems    Diagnosis Date Noted   Premenstrual syndrome 12/17/2010   Menopausal symptoms 05/05/2018   Perimenopausal 06/06/2019   Mood changes 07/06/2021   Post-menopausal 07/07/2021   Past Medical History:  Diagnosis Date   Asthma    Obesity      ROS  See HPI.     Objective:    LMP 07/06/2017  BP Readings from Last 3 Encounters:  12/21/21 120/74  10/15/21 126/83  10/01/21 131/82   Wt Readings from Last 3 Encounters:   12/21/21 247 lb (112 kg)  10/15/21 247 lb (112 kg)  10/01/21 247 lb (112 kg)    .Marland Kitchen Results for orders placed or performed in visit on 12/20/22  POCT URINALYSIS DIP (CLINITEK)  Result Value Ref Range   Color, UA yellow yellow   Clarity, UA clear clear   Glucose, UA negative negative mg/dL   Bilirubin, UA negative negative   Ketones, POC UA negative negative mg/dL   Spec Grav, UA 4.098 1.191 - 1.025   Blood, UA negative negative   pH, UA 7.0 5.0 - 8.0   POC PROTEIN,UA negative negative, trace   Urobilinogen, UA 0.2 0.2 or 1.0 E.U./dL   Nitrite, UA Negative Negative   Leukocytes, UA Negative Negative   Cryotherapy Procedure Note  Pre-operative Diagnosis: irritated SK  Post-operative Diagnosis: same  Locations: left cheek  Indications: inflammation   Procedure Details  History of allergy to iodine: no. Pacemaker? no.  Patient informed of risks (permanent scarring, infection, light or dark discoloration, bleeding, infection, weakness, numbness and recurrence of the lesion) and benefits of the procedure and verbal informed consent obtained.  The areas are treated with liquid nitrogen therapy, frozen until ice ball extended 2 mm beyond lesion, allowed to thaw, and treated again. The patient tolerated procedure well.  The patient was instructed on post-op care, warned that there may be blister formation, redness and pain. Recommend OTC analgesia as needed for pain.  Condition: Stable  Complications: none.  Plan: 1. Instructed to keep the area dry and covered for 24-48h and clean thereafter. 2. Warning signs of infection were reviewed.   3. Recommended that the patient use OTC ibuprofen as needed for pain.    Physical Exam Constitutional:      Appearance: Normal appearance. She is obese.  HENT:     Head: Normocephalic.  Cardiovascular:     Rate and Rhythm: Normal rate.  Abdominal:     General: There is no distension.     Palpations: Abdomen is soft. There is no  mass.     Tenderness: There is no abdominal tenderness. There is no right CVA tenderness, left CVA tenderness, guarding or rebound.     Hernia: No hernia is present.  Musculoskeletal:     Cervical back: Normal range of motion and neck supple.     Right lower leg: No edema.     Left lower leg: No edema.  Skin:    Comments: Irritated SK on left cheek Salmon colored macule with no scaling on right anterior upper thigh approximately the size of quarter  Neurological:     General: No focal deficit present.     Mental Status: She is alert and oriented to person, place, and time.  Psychiatric:        Mood and Affect: Mood normal.          Assessment & Plan:  Marland KitchenMarland KitchenBernetta was seen today for dysuria.  Diagnoses and all orders for this visit:  Urethritis -     POCT URINALYSIS DIP (CLINITEK) -     Urine Culture  Dysuria -     POCT URINALYSIS DIP (CLINITEK) -     Urine Culture  Seborrheic keratoses  Salmon patch nevus -     triamcinolone cream (KENALOG) 0.1 %; Apply 1 Application topically 2 (two) times daily.   UA negative for blood, protein, leuks, nitrites.  Will culture For now symptoms are gone and suspect more inflammation of urethra Drink plenty of water Vitals look good Follow up as needed  SKs-cryotherapy today done.  Salmon patch- unclear etiology will refer to dermatology. ? Eczema will do trial of topical steroid    Return if symptoms worsen or fail to improve.  Tandy Gaw, PA-C

## 2022-12-20 NOTE — Patient Instructions (Addendum)
Urethritis, Adult  Urethritis is a swelling (inflammation) of the urethra. The urethra is the tube that drains urine from the bladder. It is important to get treatment for this condition early. Delayed treatment may lead to complications, such as an infection in the urinary tract (ureters, kidneys, and bladder). What are the causes? This condition may be caused by: Germs that are spread through sexual contact. This is the leading cause of urethritis. This may include bacterial or viral infections. Injury to the urethra. This can happen after a thin, flexible tube (catheter) is inserted into the urethra to drain urine, or after medical instruments or foreign bodies are inserted into the area. Chemical irritation. This may include contact with spermicide or prolonged contact with chemicals in bubble bath, shampoo, or perfumed soaps. A disease that causes inflammation. This is rare. What increases the risk? The following factors may make you more likely to develop this condition: Having sex without using a condom. Having multiple sexual partners. Having poor hygiene. What are the signs or symptoms? Symptoms of this condition include: Pain with urination. Frequent urination. An urgent need to urinate. Itching and pain in the vagina or penis. Discharge or bleeding coming from the penis. Most women have no symptoms. How is this diagnosed? This condition may be diagnosed based on: Your symptoms. Your medical history. A physical exam. Tests may also be done. These may include: Urine tests. Swabs from the urethra. How is this treated? Treatment for this condition depends on the cause. Urethritis caused by a bacterial infection is treated with antibiotic medicine. Sexual partners must also be treated. Follow these instructions at home: Medicines Take over-the-counter and prescription medicines only as told by your health care provider. If you were prescribed an antibiotic, take it as  told by your health care provider. Do not stop taking the antibiotic even if you start to feel better. Lifestyle Avoid using perfumed soaps, bubble bath, and shampoo when you bathe or shower. Rinse the vaginal area after bathing. Wear cotton underwear. Not wearing underwear when going to sleep can help. Make sure to wipe from front to back after using the toilet if you are female. Do not have sex until your health care provider approves. When you do have sex, be sure to practice safe sex. Any sexual partners you have had in the past 60 days should be treated. General instructions Drink enough fluid to keep your urine pale yellow. It is up to you to get your test results. Ask your health care provider, or the department that is doing the test, when your results will be ready. Keep all follow-up visits. This is important. Get tested again 3 months after treatment to make sure the infection is gone. It is important that your sexual partner also gets tested again. Contact a health care provider if: Your symptoms have not improved after 3 days. Your symptoms get worse. You have eye redness or pain. You develop abdominal pain or pelvic pain (in females). You develop joint pain or a rash. You have a fever or chills. Get help right away if: You have severe pain in the belly, back, or side. You vomit repeatedly. Summary Urethritis is a swelling (inflammation) of the urethra. Germs that are spread through sexual contact are the most common cause of this condition. It is important to get treatment for this condition early. Delayed treatment may lead to complications. Treatment for this condition depends on the cause. Any sexual partners must also be treated. This information is not  intended to replace advice given to you by your health care provider. Make sure you discuss any questions you have with your health care provider. Document Revised: 11/04/2019 Document Reviewed: 11/04/2019 Elsevier  Patient Education  2024 [employer] Seborrheic Keratosis A seborrheic keratosis is a common, noncancerous (benign) skin growth. These growths are velvety, waxy, or rough spots that appear on the skin. They are often tan, brown, or black. The skin growths can be flat or raised and may be scaly. What are the causes? The cause of this condition is not known. What increases the risk? You are more likely to develop this condition if you: Have a family history of seborrheic keratosis. Are 7 years old or older. Are pregnant. Have had estrogen replacement therapy. What are the signs or symptoms? Symptoms of this condition include growths on the face, chest, shoulders, back, or other areas. These growths: Are usually painless, but may become irritated and itchy. Can be tan, yellow, brown, black, or other colors. Are slightly raised or have a flat surface. Are sometimes rough or wart-like in texture. Are often velvety or waxy on the surface. Are round or oval-shaped. Often occur in groups, but may occur as a single growth. How is this diagnosed? This condition is diagnosed with a medical history and physical exam. A sample of the growth may be tested (skin biopsy). You may also need to see a skin specialist (dermatologist). How is this treated? Treatment is not usually needed for this condition unless the growths are irritated or bleed often. You may also choose to have the growths removed if you do not like their appearance. Growth removal may include a procedure in which: Liquid nitrogen is applied to "freeze" off the growth (cryosurgery). This is the most common procedure. The growth is burned off with electricity (electrocautery). The growth is removed by scraping (curettage). Follow these instructions at home: Watch your growth or growths for any changes. Do not scratch or pick at the growth or growths. This can cause them to become irritated or infected. Contact a health care provider  if: You suddenly have many new growths. Your growth bleeds, itches, or hurts. Your growth suddenly becomes larger or changes color. Summary A seborrheic keratosis is a common, noncancerous skin growth. Treatment is not usually needed for this condition unless the growths are irritated or bleed often. Watch your growth or growths for any changes. Contact a health care provider if you suddenly have many new growths or your growth suddenly becomes larger or changes color. This information is not intended to replace advice given to you by your health care provider. Make sure you discuss any questions you have with your health care provider. Document Revised: 06/12/2021 Document Reviewed: 06/12/2021 Elsevier Patient Education  2024 [employer].

## 2022-12-22 LAB — SPECIMEN STATUS REPORT

## 2022-12-22 LAB — URINE CULTURE

## 2022-12-22 NOTE — Progress Notes (Signed)
No clinically significant bacteria found in urine culture.

## 2022-12-29 ENCOUNTER — Ambulatory Visit: Payer: Federal, State, Local not specified - PPO

## 2022-12-29 ENCOUNTER — Ambulatory Visit (INDEPENDENT_AMBULATORY_CARE_PROVIDER_SITE_OTHER): Payer: Federal, State, Local not specified - PPO

## 2022-12-29 DIAGNOSIS — Z1231 Encounter for screening mammogram for malignant neoplasm of breast: Secondary | ICD-10-CM | POA: Diagnosis not present

## 2022-12-30 ENCOUNTER — Ambulatory Visit: Payer: Federal, State, Local not specified - PPO | Admitting: Psychology

## 2022-12-30 DIAGNOSIS — F4323 Adjustment disorder with mixed anxiety and depressed mood: Secondary | ICD-10-CM

## 2022-12-30 NOTE — Progress Notes (Signed)
PROGRESS NOTE:  Name: Rachel [name] Date: 12/30/2022 MRN: [id number] DOB: 07/30/1970 PCP: Jomarie Longs, PA-C  Time spent: :12:02 PM - 1:00 PM    Today I met with Rachel [name] and her husband Rachel [name] for in office face-to-face individual psychotherapy.  Reason for Visit /Presenting Problem: Rachel [name] is a 52 y.o. MHF who returns to treatment following the unexpected death of her mother.  She is aware that she is struggling to cope with the sudden loss of her mother.  Rachel [name] has previously been in treatment with this therapist in both individual and couple's therapy.  She has a history of depression, anxiety and adult ADHD.  Rachel [name] and her husband Rachel [name] (54) have been married for 33 years.  They met in high school.  Rachel [name] was her brother's best friend in high school.  Rachel [name] always had an eye on him but because he was her brother's friend, she thought he would never think of her that way.  Rachel [name] was in the service in [place] and Rachel [name] was living in [place].  After several years post graduation, he finally asked her out.  Six months later they got married.  They actually only dated for two weeks and the rest of their courtship was long distance.  Both Scherry and Rachel [name] are [place].  Rachel [name] was born in the [place] and lived in [place] between the ages of 1-19.  All his family is in [place].   Mental Status Exam: Appearance:   Casual     Behavior:  Appropriate  Motor:  Normal  Speech/Language:   NA  Affect:  Appropriate and Tearful  Mood:  sad  Thought process:  normal  Thought content:    WNL  Sensory/Perceptual disturbances:    WNL  Orientation:  oriented to person, place, time/date, and situation  Attention:  NA  Concentration:  Good  Memory:  WNL  Fund of knowledge:   Good  Insight:    Good  Judgment:   Good  Impulse Control:  Good    Reported Symptoms:  sad mood, pain in her chest, crying, fatigue  Risk Assessment: Danger to Self:  No Self-injurious Behavior:  No Danger to Others: No Duty to Warn:no Physical Aggression / Violence:No  Access to Firearms a concern: No  Gang Involvement:No  Substance Abuse History: Current substance abuse: No     Past Psychiatric History:   Previous psychological history is significant for ADHD, anxiety, and depression Outpatient Providers: previous episodes of therapy with this therapist History of Psych Hospitalization: No  Psychological Testing: Attention/ADHD:  unknown    Abuse History:  Victim of: No.,  n/a    Report needed: No. Victim of NeglectNo. Perpetrator of  n/a   Witness / Exposure to Domestic Violence: No   Protective Services Involvement: No  Witness to [employer] Violence:  No   Family History:  Family History  Problem Relation Age of Onset   Healthy Mother    Hypertension Father    Diabetes Maternal Aunt    Heart attack Maternal Uncle    Heart attack Maternal Grandfather    Pancreatic cancer Paternal Grandfather    Colon cancer Neg Hx    Esophageal cancer Neg Hx    Rectal cancer Neg Hx    Stomach cancer Neg Hx    Colon polyps Neg Hx     Living situation: the patient lives with their family  Sexual Orientation: Straight  Relationship Status: married  Name of spouse / other: Rachel [name],  see above If a parent, number of children / ages: 2   Support Systems: spouse friends  Surveyor, quantity Stress:  No   Income/Employment/Disability: Employment  Financial planner: No   Educational History: Education: Water quality scientist: Protestant  Any cultural differences that may affect / interfere with treatment:  not applicable   Recreation/Hobbies: gardening  Stressors: Traumatic event    Strengths: Supportive Relationships, Family, Friends, Warehouse manager, Spirituality, Journalist, newspaper, and Able to Communicate Effectively  Barriers:  None   Legal History: Pending legal issue / charges: The patient has no significant history of legal issues. History of legal  issue / charges:  n/a  Medical History/Surgical History: reviewed Past Medical History:  Diagnosis Date   Asthma    childhood   Obesity    Seasonal allergies     Past Surgical History:  Procedure Laterality Date   TONSILLECTOMY     WISDOM TOOTH EXTRACTION      Medications: Current Outpatient Medications  Medication Sig Dispense Refill   albuterol (VENTOLIN HFA) 108 (90 Base) MCG/ACT inhaler Inhale 1-2 puffs into the lungs every 6 (six) hours as needed for wheezing or shortness of breath. 8 g 0   diclofenac Sodium (VOLTAREN) 1 % GEL Apply 4 g topically 4 (four) times daily. To affected joint. 100 g 5   fexofenadine (ALLEGRA) 180 MG tablet Take 180 mg by mouth daily.     fluticasone (FLONASE) 50 MCG/ACT nasal spray Place into both nostrils daily.     methylphenidate (RITALIN) 5 MG tablet Take 5 mg by mouth daily.     montelukast (SINGULAIR) 10 MG tablet Take 1 tablet (10 mg total) by mouth at bedtime. 90 tablet 0   triamcinolone cream (KENALOG) 0.1 % Apply 1 Application topically 2 (two) times daily. 30 g 0   VITAMIN D PO Take 1 tablet by mouth daily at 2 am.     VITAMIN K PO Take 2 capsules by mouth daily at 6 (six) AM.     No current facility-administered medications for this visit.    Allergies  Allergen Reactions   Augmentin [Amoxicillin-Pot Clavulanate] Nausea Only    GI symptoms    Individualized Treatment Plan        Strengths: bright, verbal, willing and open to learn new approaches to cope  Supports: spouse, adult children, sister   Goal/Needs for Treatment:  In order of importance to patient 1) Processing grief and loss over her mother's death 2) Assist in continued adjustment to new phase of life (ie,, retirement, aging parents) 3) Assist with challenges related to  parenting adult children   Client Statement of Needs: Returns to therapy after the death of her mother and requires support and guidance with aging father who lives in another country, around  husband's retirement and parenting adult children   Treatment Level: Individual weekly/biweekly outpatient psychotherapy  Symptoms: excessive worry, disrupted sleep, sadness, irritable, sensory sensitivity  Client Treatment Preferences: Continue with previous therapist   Healthcare consumer's goal for treatment:  Psychologist, Rachel [name], Ph.D. will support the patient's ability to achieve the goals identified. Cognitive Behavioral Therapy, Dialectical Behavioral Therapy, Motivational Interviewing, Behavior Activation and other evidenced-based practices will be used to promote progress towards healthy functioning.   Healthcare consumer Rachel [name] will: Actively participate in therapy, working towards healthy functioning.    *Justification for Continuation/Discontinuation of Goal: R=Revised, O=Ongoing, A=Achieved, D=Discontinued  Goal 1) Processing grief and loss over her mother's death  Likert rating baseline date: 11/17/2022 Target Date Goal  Was reviewed Status Code Progress towards goal/Likert rating  11/17/2022            O              Goal 2) Assist in continued adjustment to new phase of life (ie,, retirement, aging parents)  Likert rating baseline date: 11/17/2022 Target Date Goal Was reviewed Status Code Progress towards goal/Likert rating  11/17/2022            O              Goal 3) Assist with challenges related to parenting adult children  Likert rating baseline date: 11/17/2022 Target Date Goal Was reviewed Status Code Progress towards goal/Likert rating  11/17/2023            O              This plan has been reviewed and created by the following participants:  This plan will be reviewed at least every 12 months. Date Behavioral Health Clinician Date Guardian/Patient   11/17/2022 Rachel [name], Ph.D.  11/17/2022 Rachel [name]                    Diagnoses:  Adjustment Disorder with Mixed Depressed and Anxious Mood  Rachel [name] reports that there have been some new  developments.   Last session she shared that her daughter was having problems in her marriage.  She called the stating that she was "done" with her husband and needed to stay at their house.  We d/e/p what occurred, the difficulties they are having communicating with one another and what they have tried to do without success.  I d/ and helped them p/ the interplay of family dynamics, personalities and the added complication of ADHD.  By the end of the session they had a better idea about how to better manage the difficulties they have been having with their daughter and provide support while allowing her to make independent decisions.  Rachel Favors, PhD

## 2022-12-31 ENCOUNTER — Other Ambulatory Visit: Payer: Self-pay | Admitting: Obstetrics and Gynecology

## 2022-12-31 DIAGNOSIS — R928 Other abnormal and inconclusive findings on diagnostic imaging of breast: Secondary | ICD-10-CM

## 2023-01-11 ENCOUNTER — Ambulatory Visit: Payer: Federal, State, Local not specified - PPO | Admitting: Sports Medicine

## 2023-01-12 ENCOUNTER — Other Ambulatory Visit: Payer: Self-pay | Admitting: Obstetrics and Gynecology

## 2023-01-12 ENCOUNTER — Ambulatory Visit
Admission: RE | Admit: 2023-01-12 | Discharge: 2023-01-12 | Disposition: A | Discharge status: Home / Self Care | Payer: Federal, State, Local not specified - PPO | Source: Ambulatory Visit | Attending: Obstetrics and Gynecology

## 2023-01-12 ENCOUNTER — Ambulatory Visit: Payer: Federal, State, Local not specified - PPO | Admitting: Psychology

## 2023-01-12 DIAGNOSIS — F4323 Adjustment disorder with mixed anxiety and depressed mood: Secondary | ICD-10-CM | POA: Diagnosis not present

## 2023-01-12 DIAGNOSIS — R928 Other abnormal and inconclusive findings on diagnostic imaging of breast: Secondary | ICD-10-CM | POA: Diagnosis not present

## 2023-01-12 DIAGNOSIS — N6310 Unspecified lump in the right breast, unspecified quadrant: Secondary | ICD-10-CM

## 2023-01-12 DIAGNOSIS — N6315 Unspecified lump in the right breast, overlapping quadrants: Secondary | ICD-10-CM | POA: Diagnosis not present

## 2023-01-12 NOTE — Progress Notes (Addendum)
PROGRESS NOTE:  Name: Rachel [name] Date: 01/12/2023 MRN: [id number] DOB: [birth year] PCP: Jomarie Longs, PA-C  Time spent: 11:02 AM - 11:59 AM   Today I met with Rachel [name] and her husband [name] for in office face-to-face individual psychotherapy.  Reason for Visit /Presenting Problem: Rachel [name] is a 52 y.o. MHF who returns to treatment following the unexpected death of her mother.  She is aware that she is struggling to cope with the sudden loss of her mother.  Rachel [name] has previously been in treatment with this therapist in both individual and couple's therapy.  She has a history of depression, anxiety and adult ADHD.  Rachel [name] and her husband Norm (54) have been married for 33 years.  They met in high school.  Norm was her brother's best friend in high school.  Rachel [name] always had an eye on him but because he was her brother's friend, she thought he would never think of her that way.  Norm was in the service in [place] and Rosann was living in [place].  After several years post graduation, he finally asked her out.  Six months later they got married.  They actually only dated for two weeks and the rest of their courtship was long distance.  Both Driana and Norm are [place].  Norm was born in the [place] and lived in [place] between the ages of 1-19.  All his family is in [place].   Mental Status Exam: Appearance:   Casual     Behavior:  Appropriate  Motor:  Normal  Speech/Language:   NA  Affect:  Appropriate and Tearful  Mood:  sad  Thought process:  normal  Thought content:    WNL  Sensory/Perceptual disturbances:    WNL  Orientation:  oriented to person, place, time/date, and situation  Attention:  NA  Concentration:  Good  Memory:  WNL  Fund of knowledge:   Good  Insight:    Good  Judgment:   Good  Impulse Control:  Good    Reported Symptoms:  sad mood, pain in her chest, crying, fatigue  Risk Assessment: Danger to Self:  No Self-injurious Behavior:  No Danger to Others: No Duty to Warn:no Physical Aggression / Violence:No  Access to Firearms a concern: No  Gang Involvement:No  Substance Abuse History: Current substance abuse: No     Past Psychiatric History:   Previous psychological history is significant for ADHD, anxiety, and depression Outpatient Providers: previous episodes of therapy with this therapist History of Psych Hospitalization: No  Psychological Testing: Attention/ADHD:  unknown    Abuse History:  Victim of: No.,  n/a    Report needed: No. Victim of NeglectNo. Perpetrator of  n/a   Witness / Exposure to Domestic Violence: No   Protective Services Involvement: No  Witness to [employer] Violence:  No   Family History:  Family History  Problem Relation Age of Onset   Healthy Mother    Hypertension Father    Diabetes Maternal Aunt    Heart attack Maternal Uncle    Heart attack Maternal Grandfather    Pancreatic cancer Paternal Grandfather    Colon cancer Neg Hx    Esophageal cancer Neg Hx    Rectal cancer Neg Hx    Stomach cancer Neg Hx    Colon polyps Neg Hx     Living situation: the patient lives with their family  Sexual Orientation: Straight  Relationship Status: married  Name of spouse / other: Norm,  see above If a parent, number of children / ages: 2   Support Systems: spouse friends  Surveyor, quantity Stress:  No   Income/Employment/Disability: Employment  Financial planner: No   Educational History: Education: Water quality scientist: Protestant  Any cultural differences that may affect / interfere with treatment:  not applicable   Recreation/Hobbies: gardening  Stressors: Traumatic event    Strengths: Supportive Relationships, Family, Friends, Warehouse manager, Spirituality, Journalist, newspaper, and Able to Communicate Effectively  Barriers:  None   Legal History: Pending legal issue / charges: The patient has no significant history of legal issues. History of legal  issue / charges:  n/a  Medical History/Surgical History: reviewed Past Medical History:  Diagnosis Date   Asthma    childhood   Obesity    Seasonal allergies     Past Surgical History:  Procedure Laterality Date   TONSILLECTOMY     WISDOM TOOTH EXTRACTION      Medications: Current Outpatient Medications  Medication Sig Dispense Refill   albuterol (VENTOLIN HFA) 108 (90 Base) MCG/ACT inhaler Inhale 1-2 puffs into the lungs every 6 (six) hours as needed for wheezing or shortness of breath. 8 g 0   diclofenac Sodium (VOLTAREN) 1 % GEL Apply 4 g topically 4 (four) times daily. To affected joint. 100 g 5   fexofenadine (ALLEGRA) 180 MG tablet Take 180 mg by mouth daily.     fluticasone (FLONASE) 50 MCG/ACT nasal spray Place into both nostrils daily.     methylphenidate (RITALIN) 5 MG tablet Take 5 mg by mouth daily.     montelukast (SINGULAIR) 10 MG tablet Take 1 tablet (10 mg total) by mouth at bedtime. 90 tablet 0   triamcinolone cream (KENALOG) 0.1 % Apply 1 Application topically 2 (two) times daily. 30 g 0   VITAMIN D PO Take 1 tablet by mouth daily at 2 am.     VITAMIN K PO Take 2 capsules by mouth daily at 6 (six) AM.     No current facility-administered medications for this visit.    Allergies  Allergen Reactions   Augmentin [Amoxicillin-Pot Clavulanate] Nausea Only    GI symptoms    Individualized Treatment Plan        Strengths: bright, verbal, willing and open to learn new approaches to cope  Supports: spouse, adult children, sister   Goal/Needs for Treatment:  In order of importance to patient 1) Processing grief and loss over her mother's death 2) Assist in continued adjustment to new phase of life (ie,, retirement, aging parents) 3) Assist with challenges related to  parenting adult children   Client Statement of Needs: Returns to therapy after the death of her mother and requires support and guidance with aging father who lives in another country, around  husband's retirement and parenting adult children   Treatment Level: Individual weekly/biweekly outpatient psychotherapy  Symptoms: excessive worry, disrupted sleep, sadness, irritable, sensory sensitivity  Client Treatment Preferences: Continue with previous therapist   Healthcare consumer's goal for treatment:  Psychologist, Hilma Favors, Ph.D. will support the patient's ability to achieve the goals identified. Cognitive Behavioral Therapy, Dialectical Behavioral Therapy, Motivational Interviewing, Behavior Activation and other evidenced-based practices will be used to promote progress towards healthy functioning.   Healthcare consumer Kymberlee Machovec will: Actively participate in therapy, working towards healthy functioning.    *Justification for Continuation/Discontinuation of Goal: R=Revised, O=Ongoing, A=Achieved, D=Discontinued  Goal 1) Processing grief and loss over her mother's death  Likert rating baseline date: 11/17/2022 Target Date Goal  Was reviewed Status Code Progress towards goal/Likert rating  01/27/2024            O              Goal 2) Assist in continued adjustment to new phase of life (ie,, retirement, aging parents)  Likert rating baseline date: 11/17/2022 Target Date Goal Was reviewed Status Code Progress towards goal/Likert rating  01/27/2024            O              Goal 3) Assist with challenges related to parenting adult children  Likert rating baseline date: 11/17/2022 Target Date Goal Was reviewed Status Code Progress towards goal/Likert rating  01/27/2024            O              This plan has been reviewed and created by the following participants:  This plan will be reviewed at least every 12 months. Date Behavioral Health Clinician Date Guardian/Patient   11/17/2022 Hilma Favors, Ph.D.  11/17/2022 Caryl Comes                    Diagnoses:  Adjustment Disorder with Mixed Depressed and Anxious Mood  Mistique and Norm reports that their daughter  moved back in with her husband.  We d/e/p the dynamics in play, their frustration about what they can do, and how to navigate what's become an awkward relationship with their son-in-law.  I provided guidance around parenting an adult child, practical communication strategies and offered the support they needed during this challenging time.   Hilma Favors, PhD

## 2023-03-03 ENCOUNTER — Ambulatory Visit: Payer: Federal, State, Local not specified - PPO | Admitting: Psychology

## 2023-03-03 DIAGNOSIS — F4323 Adjustment disorder with mixed anxiety and depressed mood: Secondary | ICD-10-CM | POA: Diagnosis not present

## 2023-03-03 NOTE — Progress Notes (Addendum)
PROGRESS NOTE:  Name: Rachel [name] Date: 03/03/2023 MRN: [id number] DOB: 11/22/1970 PCP: Jomarie Longs, PA-C  Time spent: 1:02 PM - 1:59 PM   Today I met with Rachel [name] and her husband Rachel [name] for in office face-to-face individual psychotherapy.  Reason for Visit /Presenting Problem: Rachel [name] is a 52 y.o. MHF who returns to treatment following the unexpected death of her mother.  She is aware that she is struggling to cope with the sudden loss of her mother.  Rachel [name] has previously been in treatment with this therapist in both individual and couple's therapy.  She has a history of depression, anxiety and adult ADHD.  Rachel [name] and her husband Rachel [name] (54) have been married for 33 years.  They met in high school.  Rachel [name] was her brother's best friend in high school.  Rachel [name] always had an eye on him but because he was her brother's friend, she thought he would never think of her that way.  Rachel [name] was in the service in [place] and Rachel [name] was living in [place].  After several years post graduation, he finally asked her out.  Six months later they got married.  They actually only dated for two weeks and the rest of their courtship was long distance.  Both Rachel [name] and Rachel [name] are [place].  Rachel [name] was born in the [place] and lived in [place] between the ages of 1-19.  All his family is in [place].   Mental Status Exam: Appearance:   Casual     Behavior:  Appropriate  Motor:  Normal  Speech/Language:   NA  Affect:  Appropriate and Tearful  Mood:  sad  Thought process:  normal  Thought content:    WNL  Sensory/Perceptual disturbances:    WNL  Orientation:  oriented to person, place, time/date, and situation  Attention:  NA  Concentration:  Good  Memory:  WNL  Fund of knowledge:   Good  Insight:    Good  Judgment:   Good  Impulse Control:  Good    Reported Symptoms:  sad mood, pain in her chest, crying, fatigue  Risk Assessment: Danger to Self:  No Self-injurious Behavior: No Danger  to Others: No Duty to Warn:no Physical Aggression / Violence:No  Access to Firearms a concern: No  Gang Involvement:No  Substance Abuse History: Current substance abuse: No     Past Psychiatric History:   Previous psychological history is significant for ADHD, anxiety, and depression Outpatient Providers: previous episodes of therapy with this therapist History of Psych Hospitalization: No  Psychological Testing: Attention/ADHD:  unknown    Abuse History:  Victim of: No.,  n/a    Report needed: No. Victim of NeglectNo. Perpetrator of  n/a   Witness / Exposure to Domestic Violence: No   Protective Services Involvement: No  Witness to [employer] Violence:  No   Family History:  Family History  Problem Relation Age of Onset   Healthy Mother    Hypertension Father    Diabetes Maternal Aunt    Heart attack Maternal Uncle    Heart attack Maternal Grandfather    Pancreatic cancer Paternal Grandfather    Colon cancer Neg Hx    Esophageal cancer Neg Hx    Rectal cancer Neg Hx    Stomach cancer Neg Hx    Colon polyps Neg Hx     Living situation: the patient lives with their family  Sexual Orientation: Straight  Relationship Status: married  Name of spouse / other: Rachel [name], see  above If a parent, number of children / ages: 2   Support Systems: spouse friends  Surveyor, quantity Stress:  No   Income/Employment/Disability: Employment  Financial planner: No   Educational History: Education: Water quality scientist: Protestant  Any cultural differences that may affect / interfere with treatment:  not applicable   Recreation/Hobbies: gardening  Stressors: Traumatic event    Strengths: Supportive Relationships, Family, Friends, Warehouse manager, Spirituality, Journalist, newspaper, and Able to Communicate Effectively  Barriers:  None   Legal History: Pending legal issue / charges: The patient has no significant history of legal issues. History of legal issue /  charges:  n/a  Medical History/Surgical History: reviewed Past Medical History:  Diagnosis Date   Asthma    childhood   Obesity    Seasonal allergies     Past Surgical History:  Procedure Laterality Date   TONSILLECTOMY     WISDOM TOOTH EXTRACTION      Medications: Current Outpatient Medications  Medication Sig Dispense Refill   albuterol (VENTOLIN HFA) 108 (90 Base) MCG/ACT inhaler Inhale 1-2 puffs into the lungs every 6 (six) hours as needed for wheezing or shortness of breath. 8 g 0   diclofenac Sodium (VOLTAREN) 1 % GEL Apply 4 g topically 4 (four) times daily. To affected joint. 100 g 5   fexofenadine (ALLEGRA) 180 MG tablet Take 180 mg by mouth daily.     fluticasone (FLONASE) 50 MCG/ACT nasal spray Place into both nostrils daily.     methylphenidate (RITALIN) 5 MG tablet Take 5 mg by mouth daily.     montelukast (SINGULAIR) 10 MG tablet Take 1 tablet (10 mg total) by mouth at bedtime. 90 tablet 0   triamcinolone cream (KENALOG) 0.1 % Apply 1 Application topically 2 (two) times daily. 30 g 0   VITAMIN D PO Take 1 tablet by mouth daily at 2 am.     VITAMIN K PO Take 2 capsules by mouth daily at 6 (six) AM.     No current facility-administered medications for this visit.    Allergies  Allergen Reactions   Augmentin [Amoxicillin-Pot Clavulanate] Nausea Only    GI symptoms    Individualized Treatment Plan        Strengths: bright, verbal, willing and open to learn new approaches to cope  Supports: spouse, adult children, sister   Goal/Needs for Treatment:  In order of importance to patient 1) Processing grief and loss over her mother's death 2) Assist in continued adjustment to new phase of life (ie,, retirement, aging parents) 3) Assist with challenges related to  parenting adult children   Client Statement of Needs: Returns to therapy after the death of her mother and requires support and guidance with aging father who lives in another country, around husband's  retirement and parenting adult children   Treatment Level: Individual weekly/biweekly outpatient psychotherapy  Symptoms: excessive worry, disrupted sleep, sadness, irritable, sensory sensitivity  Client Treatment Preferences: Continue with previous therapist   Healthcare consumer's goal for treatment:  Psychologist, Hilma Favors, Ph.D. will support the patient's ability to achieve the goals identified. Cognitive Behavioral Therapy, Dialectical Behavioral Therapy, Motivational Interviewing, Behavior Activation and other evidenced-based practices will be used to promote progress towards healthy functioning.   Healthcare consumer Rachel [name] will: Actively participate in therapy, working towards healthy functioning.    *Justification for Continuation/Discontinuation of Goal: R=Revised, O=Ongoing, A=Achieved, D=Discontinued  Goal 1) Processing grief and loss over her mother's death  Likert rating baseline date: 11/17/2022 Target Date Goal Was  reviewed Status Code Progress towards goal/Likert rating  01/27/2024            O              Goal 2) Assist in continued adjustment to new phase of life (ie,, retirement, aging parents)  Likert rating baseline date: 11/17/2022 Target Date Goal Was reviewed Status Code Progress towards goal/Likert rating  01/27/2024            O              Goal 3) Assist with challenges related to parenting adult children  Likert rating baseline date: 11/17/2022 Target Date Goal Was reviewed Status Code Progress towards goal/Likert rating  01/27/2024            O              This plan has been reviewed and created by the following participants:  This plan will be reviewed at least every 12 months. Date Behavioral Health Clinician Date Guardian/Patient   11/17/2022 Hilma Favors, Ph.D.  11/17/2022 Caryl Comes                    Diagnoses:  Adjustment Disorder with Mixed Depressed and Anxious Mood  In session today Rachel [name] reports that their trip went  well, but she was sore on her return.  We d/e/p   Hilma Favors, PhD

## 2023-03-09 DIAGNOSIS — M25562 Pain in left knee: Secondary | ICD-10-CM | POA: Diagnosis not present

## 2023-03-09 DIAGNOSIS — M7062 Trochanteric bursitis, left hip: Secondary | ICD-10-CM | POA: Diagnosis not present

## 2023-03-09 DIAGNOSIS — M25561 Pain in right knee: Secondary | ICD-10-CM | POA: Diagnosis not present

## 2023-03-09 DIAGNOSIS — M170 Bilateral primary osteoarthritis of knee: Secondary | ICD-10-CM | POA: Diagnosis not present

## 2023-03-09 DIAGNOSIS — M25552 Pain in left hip: Secondary | ICD-10-CM | POA: Diagnosis not present

## 2023-03-16 DIAGNOSIS — J019 Acute sinusitis, unspecified: Secondary | ICD-10-CM | POA: Diagnosis not present

## 2023-03-18 DIAGNOSIS — Z713 Dietary counseling and surveillance: Secondary | ICD-10-CM | POA: Diagnosis not present

## 2023-03-18 DIAGNOSIS — Z6838 Body mass index (BMI) 38.0-38.9, adult: Secondary | ICD-10-CM | POA: Diagnosis not present

## 2023-03-18 DIAGNOSIS — M2041 Other hammer toe(s) (acquired), right foot: Secondary | ICD-10-CM | POA: Diagnosis not present

## 2023-03-18 DIAGNOSIS — Z0000 Encounter for general adult medical examination without abnormal findings: Secondary | ICD-10-CM | POA: Diagnosis not present

## 2023-03-18 DIAGNOSIS — Z131 Encounter for screening for diabetes mellitus: Secondary | ICD-10-CM | POA: Diagnosis not present

## 2023-03-30 DIAGNOSIS — R1011 Right upper quadrant pain: Secondary | ICD-10-CM | POA: Diagnosis not present

## 2023-03-30 DIAGNOSIS — Z113 Encounter for screening for infections with a predominantly sexual mode of transmission: Secondary | ICD-10-CM | POA: Diagnosis not present

## 2023-03-30 DIAGNOSIS — E0789 Other specified disorders of thyroid: Secondary | ICD-10-CM | POA: Diagnosis not present

## 2023-03-30 DIAGNOSIS — Z1329 Encounter for screening for other suspected endocrine disorder: Secondary | ICD-10-CM | POA: Diagnosis not present

## 2023-04-04 DIAGNOSIS — E042 Nontoxic multinodular goiter: Secondary | ICD-10-CM | POA: Diagnosis not present

## 2023-04-04 DIAGNOSIS — E0789 Other specified disorders of thyroid: Secondary | ICD-10-CM | POA: Diagnosis not present

## 2023-04-19 DIAGNOSIS — M79672 Pain in left foot: Secondary | ICD-10-CM | POA: Diagnosis not present

## 2023-04-19 DIAGNOSIS — M7742 Metatarsalgia, left foot: Secondary | ICD-10-CM | POA: Diagnosis not present

## 2023-04-19 DIAGNOSIS — G5762 Lesion of plantar nerve, left lower limb: Secondary | ICD-10-CM | POA: Diagnosis not present

## 2023-04-21 ENCOUNTER — Ambulatory Visit: Payer: Federal, State, Local not specified - PPO | Admitting: Psychology

## 2023-04-27 ENCOUNTER — Ambulatory Visit (INDEPENDENT_AMBULATORY_CARE_PROVIDER_SITE_OTHER): Payer: Federal, State, Local not specified - PPO | Admitting: Psychology

## 2023-04-27 DIAGNOSIS — F4323 Adjustment disorder with mixed anxiety and depressed mood: Secondary | ICD-10-CM | POA: Diagnosis not present

## 2023-04-27 DIAGNOSIS — F3341 Major depressive disorder, recurrent, in partial remission: Secondary | ICD-10-CM

## 2023-04-27 NOTE — Progress Notes (Signed)
 PROGRESS NOTE:  Name: Rachel [name] Date: 04/27/2023 MRN: [id number] DOB: [birth year] PCP: Araceli Knight, PA-C  Time spent: 1:02 PM - 1:59 PM   Today I met with Adah Ungerer and her husband Norm Leedom for in office face-to-face individual psychotherapy.  Reason for Visit /Presenting Problem: Verlia Bobst is a 53 y.o. MHF who returns to treatment following the unexpected death of her mother.  She is aware that she is struggling to cope with the sudden loss of her mother.  Belkys has previously been in treatment with this therapist in both individual and couple's therapy.  She has a history of depression, anxiety and adult ADHD.  Kizmet and her husband Norm (54) have been married for 33 years.  They met in high school.  Norm was her brother's best friend in high school.  Dianelis always had an eye on him but because he was her brother's friend, she thought he would never think of her that way.  Norm was in the service in [place] and Kerly was living in [place].  After several years post graduation, he finally asked her out.  Six months later they got married.  They actually only dated for two weeks and the rest of their courtship was long distance.  Both Chenika and Norm are [place].  Norm was born in the [place]  and lived in [place] between the ages of 1-19.  All his family is in [place].   Mental Status Exam: Appearance:   Casual     Behavior:  Appropriate  Motor:  Normal  Speech/Language:   NA  Affect:  Appropriate and Tearful  Mood:  sad  Thought process:  normal  Thought content:    WNL  Sensory/Perceptual disturbances:    WNL  Orientation:  oriented to person, place, time/date, and situation  Attention:  NA  Concentration:  Good  Memory:  WNL  Fund of knowledge:   Good  Insight:    Good  Judgment:   Good  Impulse Control:  Good    Reported Symptoms:  sad mood, pain in her chest, crying, fatigue  Risk Assessment: Danger to Self:  No Self-injurious Behavior: No Danger to  Others: No Duty to Warn:no Physical Aggression / Violence:No  Access to Firearms a concern: No  Gang Involvement:No  Substance Abuse History: Current substance abuse: No     Past Psychiatric History:   Previous psychological history is significant for ADHD, anxiety, and depression Outpatient Providers: previous episodes of therapy with this therapist History of Psych Hospitalization: No  Psychological Testing: Attention/ADHD:  unknown    Abuse History:  Victim of: No.,  n/a    Report needed: No. Victim of NeglectNo. Perpetrator of  n/a   Witness / Exposure to Domestic Violence: No   Protective Services Involvement: No  Witness to [employer] Violence:  No   Family History:  Family History  Problem Relation Age of Onset   Healthy Mother    Hypertension Father    Diabetes Maternal Aunt    Heart attack Maternal Uncle    Heart attack Maternal Grandfather    Pancreatic cancer Paternal Grandfather    Colon cancer Neg Hx    Esophageal cancer Neg Hx    Rectal cancer Neg Hx    Stomach cancer Neg Hx    Colon polyps Neg Hx     Living situation: the patient lives with their family  Sexual Orientation: Straight  Relationship Status: married  Name of spouse / other: Norm, see  above If a parent, number of children / ages: 2   Support Systems: spouse friends  Surveyor, quantity Stress:  No   Income/Employment/Disability: Employment  Financial planner: No   Educational History: Education: Water quality scientist: Protestant  Any cultural differences that may affect / interfere with treatment:  not applicable   Recreation/Hobbies: gardening  Stressors: Traumatic event    Strengths: Supportive Relationships, Family, Friends, Warehouse manager, Spirituality, Journalist, newspaper, and Able to Communicate Effectively  Barriers:  None   Legal History: Pending legal issue / charges: The patient has no significant history of legal issues. History of legal issue /  charges:  n/a  Medical History/Surgical History: reviewed Past Medical History:  Diagnosis Date   Asthma    childhood   Obesity    Seasonal allergies     Past Surgical History:  Procedure Laterality Date   TONSILLECTOMY     WISDOM TOOTH EXTRACTION      Medications: Current Outpatient Medications  Medication Sig Dispense Refill   albuterol  (VENTOLIN  HFA) 108 (90 Base) MCG/ACT inhaler Inhale 1-2 puffs into the lungs every 6 (six) hours as needed for wheezing or shortness of breath. 8 g 0   diclofenac  Sodium (VOLTAREN ) 1 % GEL Apply 4 g topically 4 (four) times daily. To affected joint. 100 g 5   fexofenadine (ALLEGRA) 180 MG tablet Take 180 mg by mouth daily.     fluticasone (FLONASE) 50 MCG/ACT nasal spray Place into both nostrils daily.     methylphenidate (RITALIN) 5 MG tablet Take 5 mg by mouth daily.     montelukast  (SINGULAIR ) 10 MG tablet Take 1 tablet (10 mg total) by mouth at bedtime. 90 tablet 0   triamcinolone  cream (KENALOG ) 0.1 % Apply 1 Application topically 2 (two) times daily. 30 g 0   VITAMIN D  PO Take 1 tablet by mouth daily at 2 am.     VITAMIN K PO Take 2 capsules by mouth daily at 6 (six) AM.     No current facility-administered medications for this visit.    Allergies  Allergen Reactions   Augmentin  [Amoxicillin -Pot Clavulanate] Nausea Only    GI symptoms    Individualized Treatment Plan         Strengths: bright, verbal, willing and open to learn new approaches to cope  Supports: spouse, adult children, sister   Goal/Needs for Treatment:  In order of importance to patient 1) Processing grief and loss over her mother's death 2) Assist in continued adjustment to new phase of life (ie,, retirement, aging parents) 3) Assist with challenges related to  parenting adult children   Client Statement of Needs: Returns to therapy after the death of her mother and requires support and guidance with aging father who lives in another country, around husband's  retirement and parenting adult children   Treatment Level: Individual weekly/biweekly outpatient psychotherapy  Symptoms: excessive worry, disrupted sleep, sadness, irritable, sensory sensitivity  Client Treatment Preferences: Continue with previous therapist   Healthcare consumer's goal for treatment:  Psychologist, Elder Greening, Ph.D. will support the patient's ability to achieve the goals identified. Cognitive Behavioral Therapy, Dialectical Behavioral Therapy, Motivational Interviewing, Behavior Activation and other evidenced-based practices will be used to promote progress towards healthy functioning.   Healthcare consumer Myiah Escutia will: Actively participate in therapy, working towards healthy functioning.    *Justification for Continuation/Discontinuation of Goal: R=Revised, O=Ongoing, A=Achieved, D=Discontinued  Goal 1) Processing grief and loss over her mother's death  Likert rating baseline date: 11/17/2022 Target Date Goal  Was reviewed Status Code Progress towards goal/Likert rating  01/27/2024            O              Goal 2) Assist in continued adjustment to new phase of life (ie,, retirement, aging parents)  Likert rating baseline date: 11/17/2022 Target Date Goal Was reviewed Status Code Progress towards goal/Likert rating  01/27/2024            O              Goal 3) Assist with challenges related to parenting adult children  Likert rating baseline date: 11/17/2022 Target Date Goal Was reviewed Status Code Progress towards goal/Likert rating  01/27/2024            O              This plan has been reviewed and created by the following participants:  This plan will be reviewed at least every 12 months. Date Behavioral Health Clinician Date Guardian/Patient   11/17/2022 Elder Greening, Ph.D.  11/17/2022 Vannesa Kuyen                    Diagnoses:  Adjustment Disorder with Mixed Depressed and Anxious Mood  Depression Rating: 2 Anxiety Rating: 3  In session  today, Mahagany reports that the holidays went well.  We talked about how special the holidays were with her grandson who is at an age to appreciate what is having.  Zela shared that she been more irritable with her husband lately.  We d/e/p what's occurring, assessed that some mild depression (seasonal) was a contributing factor aa well as other dynamics.  I made some deeper inquiries and made connections between present patterns of behavior/expectations and the past.  I gave Mariaceleste several journaling prompts to reflect and write on in between sessions.    Home Practice: Journal on promts provided insession  Elder Greening, PhD

## 2023-05-10 DIAGNOSIS — J382 Nodules of vocal cords: Secondary | ICD-10-CM | POA: Diagnosis not present

## 2023-05-10 DIAGNOSIS — E041 Nontoxic single thyroid nodule: Secondary | ICD-10-CM | POA: Diagnosis not present

## 2023-05-12 DIAGNOSIS — J382 Nodules of vocal cords: Secondary | ICD-10-CM | POA: Diagnosis not present

## 2023-05-12 DIAGNOSIS — J385 Laryngeal spasm: Secondary | ICD-10-CM | POA: Diagnosis not present

## 2023-05-12 DIAGNOSIS — R490 Dysphonia: Secondary | ICD-10-CM | POA: Diagnosis not present

## 2023-05-16 ENCOUNTER — Ambulatory Visit: Payer: Federal, State, Local not specified - PPO | Admitting: Psychology

## 2023-05-24 DIAGNOSIS — E041 Nontoxic single thyroid nodule: Secondary | ICD-10-CM | POA: Diagnosis not present

## 2023-06-13 ENCOUNTER — Ambulatory Visit: Payer: Federal, State, Local not specified - PPO | Admitting: Psychology

## 2023-06-22 DIAGNOSIS — Z6838 Body mass index (BMI) 38.0-38.9, adult: Secondary | ICD-10-CM | POA: Diagnosis not present

## 2023-06-22 DIAGNOSIS — M25362 Other instability, left knee: Secondary | ICD-10-CM | POA: Diagnosis not present

## 2023-06-22 DIAGNOSIS — R109 Unspecified abdominal pain: Secondary | ICD-10-CM | POA: Diagnosis not present

## 2023-06-22 DIAGNOSIS — S8992XA Unspecified injury of left lower leg, initial encounter: Secondary | ICD-10-CM | POA: Diagnosis not present

## 2023-06-22 DIAGNOSIS — M25561 Pain in right knee: Secondary | ICD-10-CM | POA: Diagnosis not present

## 2023-06-28 DIAGNOSIS — S8391XA Sprain of unspecified site of right knee, initial encounter: Secondary | ICD-10-CM | POA: Diagnosis not present

## 2023-06-28 DIAGNOSIS — S8392XA Sprain of unspecified site of left knee, initial encounter: Secondary | ICD-10-CM | POA: Diagnosis not present

## 2023-07-04 ENCOUNTER — Ambulatory Visit: Payer: Federal, State, Local not specified - PPO | Admitting: Psychology

## 2023-07-11 DIAGNOSIS — M5451 Vertebrogenic low back pain: Secondary | ICD-10-CM | POA: Diagnosis not present

## 2023-07-11 DIAGNOSIS — M546 Pain in thoracic spine: Secondary | ICD-10-CM | POA: Diagnosis not present

## 2023-07-11 DIAGNOSIS — M9902 Segmental and somatic dysfunction of thoracic region: Secondary | ICD-10-CM | POA: Diagnosis not present

## 2023-07-11 DIAGNOSIS — M9903 Segmental and somatic dysfunction of lumbar region: Secondary | ICD-10-CM | POA: Diagnosis not present

## 2023-07-13 ENCOUNTER — Other Ambulatory Visit: Payer: Federal, State, Local not specified - PPO

## 2023-07-20 DIAGNOSIS — M25561 Pain in right knee: Secondary | ICD-10-CM | POA: Diagnosis not present

## 2023-07-20 DIAGNOSIS — M170 Bilateral primary osteoarthritis of knee: Secondary | ICD-10-CM | POA: Diagnosis not present

## 2023-07-20 DIAGNOSIS — M25562 Pain in left knee: Secondary | ICD-10-CM | POA: Diagnosis not present

## 2023-07-20 DIAGNOSIS — M25461 Effusion, right knee: Secondary | ICD-10-CM | POA: Diagnosis not present

## 2023-07-21 DIAGNOSIS — M9903 Segmental and somatic dysfunction of lumbar region: Secondary | ICD-10-CM | POA: Diagnosis not present

## 2023-07-21 DIAGNOSIS — M546 Pain in thoracic spine: Secondary | ICD-10-CM | POA: Diagnosis not present

## 2023-07-21 DIAGNOSIS — M9902 Segmental and somatic dysfunction of thoracic region: Secondary | ICD-10-CM | POA: Diagnosis not present

## 2023-07-21 DIAGNOSIS — M5451 Vertebrogenic low back pain: Secondary | ICD-10-CM | POA: Diagnosis not present

## 2023-08-01 ENCOUNTER — Encounter

## 2023-08-01 ENCOUNTER — Other Ambulatory Visit

## 2023-08-11 ENCOUNTER — Ambulatory Visit
Admission: RE | Admit: 2023-08-11 | Discharge: 2023-08-11 | Disposition: A | Discharge status: Home / Self Care | Source: Ambulatory Visit | Attending: Obstetrics and Gynecology | Admitting: Obstetrics and Gynecology

## 2023-08-11 DIAGNOSIS — N6310 Unspecified lump in the right breast, unspecified quadrant: Secondary | ICD-10-CM

## 2023-08-11 DIAGNOSIS — N6315 Unspecified lump in the right breast, overlapping quadrants: Secondary | ICD-10-CM | POA: Diagnosis not present

## 2023-08-11 DIAGNOSIS — N6011 Diffuse cystic mastopathy of right breast: Secondary | ICD-10-CM | POA: Diagnosis not present

## 2023-08-11 DIAGNOSIS — R928 Other abnormal and inconclusive findings on diagnostic imaging of breast: Secondary | ICD-10-CM

## 2023-08-16 DIAGNOSIS — M9903 Segmental and somatic dysfunction of lumbar region: Secondary | ICD-10-CM | POA: Diagnosis not present

## 2023-08-16 DIAGNOSIS — M546 Pain in thoracic spine: Secondary | ICD-10-CM | POA: Diagnosis not present

## 2023-08-16 DIAGNOSIS — M5451 Vertebrogenic low back pain: Secondary | ICD-10-CM | POA: Diagnosis not present

## 2023-08-16 DIAGNOSIS — M9902 Segmental and somatic dysfunction of thoracic region: Secondary | ICD-10-CM | POA: Diagnosis not present

## 2023-08-19 DIAGNOSIS — M25561 Pain in right knee: Secondary | ICD-10-CM | POA: Diagnosis not present

## 2023-08-19 DIAGNOSIS — M94261 Chondromalacia, right knee: Secondary | ICD-10-CM | POA: Diagnosis not present

## 2023-08-19 DIAGNOSIS — M67863 Other specified disorders of tendon, right knee: Secondary | ICD-10-CM | POA: Diagnosis not present

## 2023-08-19 DIAGNOSIS — G8929 Other chronic pain: Secondary | ICD-10-CM | POA: Diagnosis not present

## 2023-08-19 DIAGNOSIS — M25461 Effusion, right knee: Secondary | ICD-10-CM | POA: Diagnosis not present

## 2023-09-12 DIAGNOSIS — M546 Pain in thoracic spine: Secondary | ICD-10-CM | POA: Diagnosis not present

## 2023-09-12 DIAGNOSIS — M9903 Segmental and somatic dysfunction of lumbar region: Secondary | ICD-10-CM | POA: Diagnosis not present

## 2023-09-12 DIAGNOSIS — M5451 Vertebrogenic low back pain: Secondary | ICD-10-CM | POA: Diagnosis not present

## 2023-09-12 DIAGNOSIS — M9902 Segmental and somatic dysfunction of thoracic region: Secondary | ICD-10-CM | POA: Diagnosis not present

## 2023-10-10 DIAGNOSIS — M5451 Vertebrogenic low back pain: Secondary | ICD-10-CM | POA: Diagnosis not present

## 2023-10-10 DIAGNOSIS — M9903 Segmental and somatic dysfunction of lumbar region: Secondary | ICD-10-CM | POA: Diagnosis not present

## 2023-10-10 DIAGNOSIS — M546 Pain in thoracic spine: Secondary | ICD-10-CM | POA: Diagnosis not present

## 2023-10-10 DIAGNOSIS — M9902 Segmental and somatic dysfunction of thoracic region: Secondary | ICD-10-CM | POA: Diagnosis not present

## 2023-12-13 ENCOUNTER — Encounter: Payer: Self-pay | Admitting: Sports Medicine

## 2023-12-13 DIAGNOSIS — M5451 Vertebrogenic low back pain: Secondary | ICD-10-CM | POA: Diagnosis not present

## 2023-12-13 DIAGNOSIS — M9903 Segmental and somatic dysfunction of lumbar region: Secondary | ICD-10-CM | POA: Diagnosis not present

## 2023-12-13 DIAGNOSIS — M9902 Segmental and somatic dysfunction of thoracic region: Secondary | ICD-10-CM | POA: Diagnosis not present

## 2023-12-13 DIAGNOSIS — M546 Pain in thoracic spine: Secondary | ICD-10-CM | POA: Diagnosis not present

## 2023-12-23 ENCOUNTER — Encounter: Payer: Self-pay | Admitting: Physician Assistant

## 2023-12-23 DIAGNOSIS — Q825 Congenital non-neoplastic nevus: Secondary | ICD-10-CM

## 2023-12-26 ENCOUNTER — Telehealth (INDEPENDENT_AMBULATORY_CARE_PROVIDER_SITE_OTHER): Admitting: Physician Assistant

## 2023-12-26 VITALS — Wt 247.0 lb

## 2023-12-26 DIAGNOSIS — E6609 Other obesity due to excess calories: Secondary | ICD-10-CM | POA: Diagnosis not present

## 2023-12-26 DIAGNOSIS — Z6838 Body mass index (BMI) 38.0-38.9, adult: Secondary | ICD-10-CM

## 2023-12-26 DIAGNOSIS — E66812 Obesity, class 2: Secondary | ICD-10-CM | POA: Diagnosis not present

## 2023-12-26 MED ORDER — TRIAMCINOLONE ACETONIDE 0.1 % EX CREA: 1.0000 | TOPICAL_CREAM | Freq: Two times a day (BID) | CUTANEOUS | 0 refills | Status: AC

## 2023-12-26 NOTE — Progress Notes (Unsigned)
..  Virtual Visit via Video Note  I connected with Rachel [name] on 12/26/23 at  3:00 PM EDT by a video enabled telemedicine application and verified that I am speaking with the correct person using two identifiers.  Location: Patient: car Provider: clinic  .SABRAParticipating in visit:  Patient: Rachel [name] Provider: Vermell Bologna PA-C   I discussed the limitations of evaluation and management by telemedicine and the availability of in person appointments. The patient expressed understanding and agreed to proceed.  History of Present Illness: Pt is a 53 yo female who calls into the clinic to discuss weight. She is frustrated with weight since she has been in menopause. Pt is walking but probably not like I should be. Pt admits to watching what she eats but not following a diet. [name] was prescribed at one point but insurance did not cover it. She wonders what the root of her weight gain is.      Observations/Objective: No acute distress Normal mood and appearance  .SABRA Today's Vitals   12/26/23 1456  Weight: 247 lb (112 kg)   Body mass index is 38.69 kg/m.    Assessment and Plan: SABRASABRADiagnoses and all orders for this visit:  Class 2 obesity due to excess calories without serious comorbidity with body mass index (BMI) of 38.0 to 38.9 in adult -     Amb Ref to Medical Weight Management   Discussed aging process and how metabolism slows and we become more insulin resistant. Labs were done about a year ago. Pt declined any repeat of labs today. It looks like all GLP-1 are not covered and if patient wanted to start she would have to go through the compounding route. Discussed contrave  and mentioned she could start that. Pt declined today. Discussed starting to track food and make sure getting at least 150 minutes of exercise a week. Discussed IF 16:8. Made referral to healthy weight and wellness. Follow up as needed.    Follow Up Instructions:    I discussed the assessment and treatment  plan with the patient. The patient was provided an opportunity to ask questions and all were answered. The patient agreed with the plan and demonstrated an understanding of the instructions.   The patient was advised to call back or seek an in-person evaluation if the symptoms worsen or if the condition fails to improve as anticipated.    Almira Phetteplace, PA-C

## 2023-12-29 ENCOUNTER — Institutional Professional Consult (permissible substitution): Admitting: Nurse Practitioner

## 2024-01-03 ENCOUNTER — Institutional Professional Consult (permissible substitution): Admitting: Nurse Practitioner

## 2024-01-04 ENCOUNTER — Ambulatory Visit: Admitting: Nurse Practitioner

## 2024-01-04 ENCOUNTER — Encounter: Payer: Self-pay | Admitting: Nurse Practitioner

## 2024-01-04 VITALS — BP 130/83 | HR 72 | Temp 98.3°F | Ht 67.0 in | Wt 245.0 lb

## 2024-01-04 DIAGNOSIS — J309 Allergic rhinitis, unspecified: Secondary | ICD-10-CM

## 2024-01-04 DIAGNOSIS — Z6838 Body mass index (BMI) 38.0-38.9, adult: Secondary | ICD-10-CM | POA: Diagnosis not present

## 2024-01-04 DIAGNOSIS — E66812 Obesity, class 2: Secondary | ICD-10-CM

## 2024-01-04 DIAGNOSIS — E65 Localized adiposity: Secondary | ICD-10-CM

## 2024-01-04 NOTE — Progress Notes (Addendum)
 Office: [phone]  /  Fax: [phone]   Initial Visit  Rachel [name] was seen in clinic today to evaluate for obesity. She is interested in losing weight to improve overall health and reduce the risk of weight related complications. She presents today to review program treatment options, initial physical assessment, and evaluation.     She was referred by: PCP  When asked what else they would like to accomplish? She states: Adopt a healthier eating pattern and lifestyle, Improve energy levels and physical activity, Improve existing medical conditions, and Improve quality of life  When asked how has your weight affected you? She states: Having fatigue and Having poor endurance  Some associated conditions: Allergies, bilateral hearing loss, plantar fasciitis, osteoarthritis of the knees,  Contributing factors: family history of obesity, consumption of processed foods, moderate to high levels of stress, menopause, and sedentary job  Weight promoting medications identified: None  Current nutrition plan: using nuum app-she is averaging around 2100 calories, 80 grams protein, 200 grams of carbs  Current level of physical activity: started exercising 1 month ago-she is walking 20 mins on the treadmill at the gym  Current or previous pharmacotherapy: None  Response to medication: Never tried medications   Past medical history includes:   Past Medical History:  Diagnosis Date   Asthma    childhood   Obesity    Seasonal allergies      Objective:   BP 130/83   Pulse 72   Temp 98.3 F (36.8 C)   Ht 5' 7 (1.702 m)   Wt 245 lb (111.1 kg)   LMP 07/06/2017   SpO2 100%   BMI 38.37 kg/m  She was weighed on the bioimpedance scale: Body mass index is 38.37 kg/m.  Peak Weight:249 lbs , Body Fat%:44.8%, Visceral Fat Rating:13, Weight trend over the last 12 months: Increasing  General:  Alert, oriented and cooperative. Patient is in no acute distress.  Respiratory: Normal  respiratory effort, no problems with respiration noted   Gait: able to ambulate independently  Mental Status: Normal mood and affect. Normal behavior. Normal judgment and thought content.   DIAGNOSTIC DATA REVIEWED:  BMET    Component Value Date/Time   NA 139 07/06/2021 0000   K 4.6 07/06/2021 0000   CL 104 07/06/2021 0000   CO2 28 07/06/2021 0000   GLUCOSE 102 (H) 07/06/2021 0000   BUN 11 07/06/2021 0000   CREATININE 0.70 07/06/2021 0000   CALCIUM 10.1 07/06/2021 0000   GFRNONAA 90 06/15/2019 0822   GFRAA 104 06/15/2019 0822   Lab Results  Component Value Date   HGBA1C 5.5 07/06/2021   No results found for: INSULIN CBC    Component Value Date/Time   WBC 6.0 07/06/2021 0000   RBC 4.83 07/06/2021 0000   HGB 13.8 07/06/2021 0000   HCT 43.2 07/06/2021 0000   PLT 344 07/06/2021 0000   MCV 89.4 07/06/2021 0000   MCH 28.6 07/06/2021 0000   MCHC 31.9 (L) 07/06/2021 0000   RDW 14.1 07/06/2021 0000   Iron/TIBC/Ferritin/ %Sat No results found for: IRON, TIBC, FERRITIN, IRONPCTSAT Lipid Panel     Component Value Date/Time   CHOL 175 07/06/2021 0000   TRIG 111 07/06/2021 0000   HDL 63 07/06/2021 0000   CHOLHDL 2.8 07/06/2021 0000   LDLCALC 91 07/06/2021 0000   Hepatic Function Panel     Component Value Date/Time   PROT 7.0 07/06/2021 0000   AST 16 07/06/2021 0000   ALT 21 07/06/2021 0000  BILITOT 0.4 07/06/2021 0000      Component Value Date/Time   TSH 1.03 07/06/2021 0000     Assessment and Plan:    Allergies She is taking Allegra, Singulair  and Flonase.  Continue to follow-up with PCP.  Visceral obesity Will continue to monitor  Class 2 obesity due to excess calories without serious comorbidity with body mass index (BMI) of 38.0 to 38.9 in adult        Obesity Treatment / Action Plan:  Patient will work on garnering support from family and friends to begin weight loss journey. Will work on eliminating or reducing the presence of highly  palatable, calorie dense foods in the home. Will complete provided nutritional and psychosocial assessment questionnaire before the next appointment. Will be scheduled for indirect calorimetry to determine resting energy expenditure in a fasting state.  This will allow us  to create a reduced calorie, high-protein meal plan to promote loss of fat mass while preserving muscle mass. Counseled on the health benefits of losing 5%-15% of total body weight. Was counseled on nutritional approaches to weight loss and benefits of reducing processed foods and consuming plant-based foods and high quality protein as part of nutritional weight management. Was counseled on pharmacotherapy and role as an adjunct in weight management.   Obesity Education Performed Today:  She was weighed on the bioimpedance scale and results were discussed and documented in the synopsis.  We discussed obesity as a disease and the importance of a more detailed evaluation of all the factors contributing to the disease.  We discussed the importance of long term lifestyle changes which include nutrition, exercise and behavioral modifications as well as the importance of customizing this to her specific health and social needs.  We discussed the benefits of reaching a healthier weight to alleviate the symptoms of existing conditions and reduce the risks of the biomechanical, metabolic and psychological effects of obesity.  Rachel [name] appears to be in the action stage of change and states they are ready to start intensive lifestyle modifications and behavioral modifications.  30 minutes was spent today on this visit including the above counseling, pre-visit chart review, and post-visit documentation.  Reviewed by clinician on day of visit: allergies, medications, problem list, medical history, surgical history, family history, social history, and previous encounter notes pertinent to obesity diagnosis.    Corean SAUNDERS Candice Lunney  FNP-C

## 2024-01-24 ENCOUNTER — Encounter: Payer: Self-pay | Admitting: Bariatrics

## 2024-01-24 ENCOUNTER — Ambulatory Visit: Admitting: Bariatrics

## 2024-01-24 VITALS — BP 126/76 | HR 54 | Temp 97.7°F | Ht 67.0 in | Wt 244.0 lb

## 2024-01-24 DIAGNOSIS — R7309 Other abnormal glucose: Secondary | ICD-10-CM

## 2024-01-24 DIAGNOSIS — Z0000 Encounter for general adult medical examination without abnormal findings: Secondary | ICD-10-CM

## 2024-01-24 DIAGNOSIS — R0602 Shortness of breath: Secondary | ICD-10-CM

## 2024-01-24 DIAGNOSIS — E669 Obesity, unspecified: Secondary | ICD-10-CM | POA: Diagnosis not present

## 2024-01-24 DIAGNOSIS — Z1331 Encounter for screening for depression: Secondary | ICD-10-CM | POA: Diagnosis not present

## 2024-01-24 DIAGNOSIS — E559 Vitamin D deficiency, unspecified: Secondary | ICD-10-CM | POA: Diagnosis not present

## 2024-01-24 DIAGNOSIS — R5383 Other fatigue: Secondary | ICD-10-CM

## 2024-01-24 DIAGNOSIS — Z6838 Body mass index (BMI) 38.0-38.9, adult: Secondary | ICD-10-CM | POA: Diagnosis not present

## 2024-01-24 DIAGNOSIS — E6609 Other obesity due to excess calories: Secondary | ICD-10-CM

## 2024-01-24 NOTE — Progress Notes (Signed)
 At a Glance:  Vitals Temp: 97.7 F (36.5 C) BP: 126/76 Pulse Rate: (!) 54 SpO2: 97 %   Anthropometric Measurements Height: 5' 7 (1.702 m) Weight: 244 lb (110.7 kg) BMI (Calculated): 38.21 Starting Weight: 244lb Peak Weight: 249lb   Body Composition  Body Fat %: 45.7 % Fat Mass (lbs): 111.6 lbs Muscle Mass (lbs): 126.2 lbs Total Body Water (lbs): 90.4 lbs Visceral Fat Rating : 13   Other Clinical Data RMR: 1714 Fasting: yes Labs: yes Today's Visit #: 1    EKG: Normal sinus rhythm, rate 57 bradycardia.    Indirect Calorimeter:   Resting Metabolic Rate ( RMR):  RMR (actual): 1714 kcal RMR (calculated): 1864 kcal The calculated basal metabolic rate is 8135 kcal  thus her basal metabolic rate is worse than expected.  Plan:   Indirect calorimeter completed, interpreted and reviewed with patient today and allowed to ask questions.  Discussed the implications for the chosen plan and exercise based on the RMR reading.  Will consider repeating the RMR in the future based on weight loss.    Chief Complaint:  Obesity   Subjective:  Rachel [name] (MR# [id number]) is a 54 y.o. female who presents for evaluation and treatment of obesity and related comorbidities.   Wyatt is currently in the action stage of change and ready to dedicate time achieving and maintaining a healthier weight. Rachel [name] is interested in becoming our patient and working on intensive lifestyle modifications including (but not limited to) diet and exercise for weight loss.  Rachel [name] has been struggling with her weight. She has been unsuccessful in either losing weight, maintaining weight loss, or reaching her healthy weight goal.  Rachel [name]'s habits were reviewed today and are as follows: Her family eats meals together, she thinks her family will eat healthier with her, she started gaining weight after the birth of her child, she has significant food cravings issues, she frequently eats larger portions  than normal, and she struggles with emotional eating.  Current or previous pharmacotherapy: None  Response to medication: Never tried medications  Other Fatigue Rachel [name] denies daytime somnolence and denies waking up still tired. Rachel [name] generally gets 7 hours of sleep per night, and states that she has generally restful sleep. Snoring is present. Apneic episodes is not present. Epworth Sleepiness Score is 7.   Shortness of Breath Rachel [name] notes increasing shortness of breath with exercising and seems to be worsening over time with weight gain. She notes getting out of breath sooner with activity than she used to. This has not gotten worse recently. Rachel [name] denies shortness of breath at rest or orthopnea.  Depression Screen Rachel [name]'s Food and Mood (modified PHQ-9) score was 2. <5 no depression     01/24/2024    8:25 AM  Depression screen PHQ 2/9  Decreased Interest 0  Down, Depressed, Hopeless 0  PHQ - 2 Score 0  Altered sleeping 0  Tired, decreased energy 0  Change in appetite 1  Feeling bad or failure about yourself  0  Trouble concentrating 0  Moving slowly or fidgety/restless 1  Suicidal thoughts 0  PHQ-9 Score 2  Difficult doing work/chores Somewhat difficult     Assessment and Plan:   Other Fatigue Rachel [name] does feel that her weight is causing her energy to be lower than it should be. Fatigue may be related to obesity, depression or many other causes. Labs will be ordered, and in the meanwhile, Rachel [name] will focus on self care including making healthy food choices, increasing physical activity  and focusing on stress reduction.  Shortness of Breath Rachel [name] does feel that she gets out of breath more easily that she used to when she exercises. Rachel [name]'s shortness of breath appears to be obesity related and exercise induced. She has agreed to work on weight loss and gradually increase exercise to treat her exercise induced shortness of breath. Will continue to monitor closely.  Health  Maintenance:   Obesity   Plan: Will do EKG, indirect calorimetry, and labs.     Vitamin D  Deficiency Vitamin D  is not at goal of 50.  Most recent vitamin D  level was 19. She is at risk for vitamin D  deficiency due to obesity.  She is on vitamin D  and K 2.  Lab Results  Component Value Date   VD25OH 31 08/19/2017    Plan: Will check for vitamin D  deficiency.   Elevated glucose:   She is obese and has had an elevated glucose in the past.  She denies diabetes but states she may have been prediabetic at 1 time.  She is not taking any medication for diabetes.   Plan: Will check hemoglobin A1c and an insulin level.  Previous labs reviewed today. No recent labs.   Labs done today CMP, Lipids, Insulin, HgbA1c, Vit D, Vit B12, Thyroid  Panel, and CBC   Generalized Obesity: BMI (Calculated): 38.21   Rachel [name] is currently in the action stage of change and her goal is to begin weight loss efforts. I recommend Rachel [name] begin the structured treatment plan as follows:  She has agreed to Category 2 Plan and keeping a food journal and adhering to recommended goals of 1,300 calories and 100 + protein  Exercise goals: All adults should avoid inactivity. Some activity is better than none, and adults who participate in any amount of physical activity, gain some health benefits.  Behavioral modification strategies:increasing vegetables, increase H2O intake, increase high fiber foods, no skipping meals, meal planning and cooking strategies, keeping healthy foods in the home, better snacking choices, avoiding temptations, and planning for success  She was informed of the importance of frequent follow-up visits to maximize her success with intensive lifestyle modifications for her multiple health conditions. She was informed we would discuss her lab results at her next visit unless there is a critical issue that needs to be addressed sooner. Rachel [name] agreed to keep her next visit at the agreed upon time to  discuss these results.  Objective:  General: Cooperative, alert, well developed, in no acute distress. HEENT: Conjunctivae and lids unremarkable. Cardiovascular: Regular rhythm.  Lungs: Normal work of breathing. Neurologic: No focal deficits.   Lab Results  Component Value Date   CREATININE 0.70 07/06/2021   BUN 11 07/06/2021   NA 139 07/06/2021   K 4.6 07/06/2021   CL 104 07/06/2021   CO2 28 07/06/2021   Lab Results  Component Value Date   ALT 21 07/06/2021   AST 16 07/06/2021   BILITOT 0.4 07/06/2021   Lab Results  Component Value Date   HGBA1C 5.5 07/06/2021   No results found for: INSULIN Lab Results  Component Value Date   TSH 1.03 07/06/2021   Lab Results  Component Value Date   CHOL 175 07/06/2021   HDL 63 07/06/2021   LDLCALC 91 07/06/2021   TRIG 111 07/06/2021   CHOLHDL 2.8 07/06/2021   Lab Results  Component Value Date   WBC 6.0 07/06/2021   HGB 13.8 07/06/2021   HCT 43.2 07/06/2021   MCV 89.4 07/06/2021   PLT  344 07/06/2021   No results found for: IRON, TIBC, FERRITIN  Attestation Statements:  Applicable history such as the following:  allergies, medications, problem list, medical history, surgical history, family history, social history, and previous encounter notes reviewed by clinician on day of visit:  Time spent on visit in care of the patient today including the items listed below was 40 minutes.    10 minutes were spent talking about the history, 30 minutes for face to face counseling implementing the plan, discussing the specifics of how to arrange meals, meal planning, water intake.   I spent face to face time discussing his/her plan, including breakfast, additional breakfast options, lunch, and dinner options, grocery list, and snacks.  I reviewed her indirect calorimetry. I discussed the implications for the diet plan.    Discussed the bio-impedence test (fat %, muscle mass, and water weight) and allowed the patient to ask  questions.   Discussed the following information sheets: Category 2, Grocery List, 100 Calorie Snacks, 200 Calorie Snacks, and Protein Shake Sheet. .   I additionally spent time documenting, reviewing, and checking the codes before submitting.   This may have been prepared with the assistance of Engineer, civil (consulting).  Occasional wrong-word or sound-a-like substitutions may have occurred due to the inherent limitations of voice recognition software.    Clayborne Daring, DO

## 2024-01-25 LAB — CBC WITH DIFFERENTIAL/PLATELET
Basophils Absolute: 0 x10E3/uL (ref 0.0–0.2)
Basos: 1 %
EOS (ABSOLUTE): 0.1 x10E3/uL (ref 0.0–0.4)
Eos: 1 %
Hematocrit: 42.1 % (ref 34.0–46.6)
Hemoglobin: 14.2 g/dL (ref 11.1–15.9)
Immature Grans (Abs): 0 x10E3/uL (ref 0.0–0.1)
Immature Granulocytes: 0 %
Lymphocytes Absolute: 1.9 x10E3/uL (ref 0.7–3.1)
Lymphs: 39 %
MCH: 30.5 pg (ref 26.6–33.0)
MCHC: 33.7 g/dL (ref 31.5–35.7)
MCV: 90 fL (ref 79–97)
Monocytes Absolute: 0.3 x10E3/uL (ref 0.1–0.9)
Monocytes: 5 %
Neutrophils Absolute: 2.6 x10E3/uL (ref 1.4–7.0)
Neutrophils: 54 %
Platelets: 523 x10E3/uL — ABNORMAL HIGH (ref 150–450)
RBC: 4.66 x10E6/uL (ref 3.77–5.28)
RDW: 16.5 % — ABNORMAL HIGH (ref 11.7–15.4)
WBC: 4.8 x10E3/uL (ref 3.4–10.8)

## 2024-01-25 LAB — HEMOGLOBIN A1C
Est. average glucose Bld gHb Est-mCnc: 114 mg/dL
Hgb A1c MFr Bld: 5.6 % (ref 4.8–5.6)

## 2024-01-25 LAB — VITAMIN D 25 HYDROXY (VIT D DEFICIENCY, FRACTURES): Vit D, 25-Hydroxy: 69.7 ng/mL (ref 30.0–100.0)

## 2024-01-25 LAB — LIPID PANEL WITH LDL/HDL RATIO
Cholesterol, Total: 161 mg/dL (ref 100–199)
HDL: 62 mg/dL (ref >39)
LDL Chol Calc (NIH): 85 mg/dL (ref 0–99)
LDL/HDL Ratio: 1.4 ratio (ref 0.0–3.2)
Triglycerides: 74 mg/dL (ref 0–149)
VLDL Cholesterol Cal: 14 mg/dL (ref 5–40)

## 2024-01-25 LAB — COMPREHENSIVE METABOLIC PANEL WITH GFR
ALT: 35 IU/L — ABNORMAL HIGH (ref 0–32)
AST: 27 IU/L (ref 0–40)
Albumin: 4.7 g/dL (ref 3.8–4.9)
Alkaline Phosphatase: 121 IU/L (ref 49–135)
BUN/Creatinine Ratio: 18 (ref 9–23)
BUN: 13 mg/dL (ref 6–24)
Bilirubin Total: 0.3 mg/dL (ref 0.0–1.2)
CO2: 22 mmol/L (ref 20–29)
Calcium: 10.3 mg/dL — ABNORMAL HIGH (ref 8.7–10.2)
Chloride: 104 mmol/L (ref 96–106)
Creatinine, Ser: 0.72 mg/dL (ref 0.57–1.00)
Globulin, Total: 2.5 g/dL (ref 1.5–4.5)
Glucose: 95 mg/dL (ref 70–99)
Potassium: 5 mmol/L (ref 3.5–5.2)
Sodium: 140 mmol/L (ref 134–144)
Total Protein: 7.2 g/dL (ref 6.0–8.5)
eGFR: 100 mL/min/1.73 (ref >59)

## 2024-01-25 LAB — VITAMIN B12: Vitamin B-12: 524 pg/mL (ref 232–1245)

## 2024-01-25 LAB — INSULIN, RANDOM: INSULIN: 15.1 u[IU]/mL (ref 2.6–24.9)

## 2024-01-25 LAB — TSH+T4F+T3FREE
Free T4: 1.32 ng/dL (ref 0.82–1.77)
T3, Free: 3.5 pg/mL (ref 2.0–4.4)
TSH: 0.684 u[IU]/mL (ref 0.450–4.500)

## 2024-01-26 ENCOUNTER — Encounter: Payer: Self-pay | Admitting: Bariatrics

## 2024-01-26 DIAGNOSIS — D75839 Thrombocytosis, unspecified: Secondary | ICD-10-CM | POA: Insufficient documentation

## 2024-01-26 DIAGNOSIS — R7401 Elevation of levels of liver transaminase levels: Secondary | ICD-10-CM | POA: Insufficient documentation

## 2024-02-04 ENCOUNTER — Ambulatory Visit
Admission: RE | Admit: 2024-02-04 | Discharge: 2024-02-04 | Disposition: A | Discharge status: Home / Self Care | Source: Ambulatory Visit | Attending: Family Medicine | Admitting: Family Medicine

## 2024-02-04 VITALS — BP 128/77 | HR 57 | Temp 98.2°F | Resp 18 | Ht 67.0 in | Wt 245.0 lb

## 2024-02-04 DIAGNOSIS — R1011 Right upper quadrant pain: Secondary | ICD-10-CM

## 2024-02-04 NOTE — Discharge Instructions (Signed)
 I am concerned this abdominal pain is caused by your gallbladder If you become acutely worse you must go to an emergency room I am going to order some blood work to check your liver enzymes, and to check for infection.  They will be available in MyChart.  You will be called if anything is abnormal. You need an ultrasound of your gallbladder.  You can call your doctor Monday morning to see if they will schedule this, or you may return here for additional testing

## 2024-02-04 NOTE — ED Provider Notes (Signed)
 Rachel [name]    CSN: [id number] Arrival date & time: 02/04/24  1030      History   Chief Complaint Chief Complaint  Patient presents with   Abdominal Pain    Entered by patient    HPI Rachel [name] is a 53 y.o. female.   Patient is here for abdominal pain.  She has had intermittent abdominal pain off and on for a long time.  It is right upper quadrant pain that will come and go.  She states that she thought that it might be her gallbladder, thought it might be gas.  It was not bad enough to obtain medical [name].  For the last week she has had a constant sensation of pressure in her right upper quadrant.  It is very uncomfortable.  It is worse with sitting and better with standing.  It is exacerbated by eating.  She is able to drink some clear liquids.  She has nausea decreased appetite.  Decreased portions.  No complaint of diarrhea or vomiting  Has had no abdominal surgeries   Past Medical History:  Diagnosis Date   ADD (attention deficit disorder)    Asthma    childhood   Back pain    History of heart attack    Joint pain    Obesity    Seasonal allergies     Patient Active Problem List   Diagnosis Date Noted   Thrombocytosis 01/26/2024   Elevated ALT measurement 01/26/2024   Seborrheic keratoses 12/20/2022   Skin lesion 12/20/2022   Acute maxillary sinusitis 04/15/2021   Bilateral ankle pain 03/18/2021   Seasonal allergies 06/10/2020   Eustachian tube dysfunction, bilateral 06/10/2020   Bilateral hearing loss 06/10/2020   Primary osteoarthritis of both knees 09/05/2019   Abnormal weight gain 06/06/2019   Mass of right eye 01/13/2018   Blepharitis of right upper eyelid 01/13/2018   Accessory skin tags 01/13/2018   Class 2 obesity due to excess calories without serious comorbidity with body mass index (BMI) of 36.0 to 36.9 in adult 08/19/2017   Pronation deformity of both feet 04/10/2015   Morton's neuroma of left foot 03/10/2015   Metatarsal  deformity 03/10/2015   Plantar fasciitis, bilateral 03/10/2015   Allergic rhinitis 12/17/2010   Tonsil stone 12/17/2010    Past Surgical History:  Procedure Laterality Date   TONSILLECTOMY     WISDOM TOOTH EXTRACTION      OB History     Gravida  2   Para  2   Term  2   Preterm      AB      Living  2      SAB      IAB      Ectopic      Multiple      Live Births               Home Medications    Prior to Admission medications   Medication Sig Start Date End Date Taking? Authorizing Provider  albuterol  (VENTOLIN  HFA) 108 (90 Base) MCG/ACT inhaler Inhale 1-2 puffs into the lungs every 6 (six) hours as needed for wheezing or shortness of breath. 01/29/22  Yes Breeback, Jade L, PA-C  diclofenac  Sodium (VOLTAREN ) 1 % GEL Apply 4 g topically 4 (four) times daily. To affected joint. 07/06/21  Yes Breeback, Jade L, PA-C  fexofenadine (ALLEGRA) 180 MG tablet Take 180 mg by mouth daily.   Yes [provider]  Flax Oil-Fish Oil-Borage Oil (FISH OIL-FLAX  OIL-BORAGE OIL PO) Take by mouth.   Yes [provider]  fluticasone (FLONASE) 50 MCG/ACT nasal spray Place into both nostrils daily.   Yes [provider]  Potassium Glycinate 99 MG CAPS Take by mouth.   Yes [provider]  triamcinolone  cream (KENALOG ) 0.1 % Apply 1 Application topically 2 (two) times daily. 12/26/23  Yes Breeback, Jade L, PA-C  VITAMIN D  PO Take 1 tablet by mouth daily at 2 am.   Yes [provider]  VITAMIN K PO Take 2 capsules by mouth daily at 6 (six) AM.   Yes [provider]    Family History Family History  Problem Relation Age of Onset   Hypertension Mother    Hypertension Father    Alcoholism Father    Heart attack Maternal Grandfather    Pancreatic cancer Paternal Grandfather    Cancer Paternal Grandfather    Diabetes Maternal Aunt    Heart attack Maternal Uncle    Colon cancer Neg Hx    Esophageal cancer Neg Hx    Rectal cancer  Neg Hx    Stomach cancer Neg Hx    Colon polyps Neg Hx     Social History Social History   Tobacco Use   Smoking status: Former    Current packs/day: 0.00    Types: Cigarettes    Quit date: 09/01/1995    Years since quitting: 28.4   Smokeless tobacco: Never  Vaping Use   Vaping status: Never Used  Substance Use Topics   Alcohol use: Yes    Alcohol/week: 1.0 standard drink of alcohol    Types: 1 Standard drinks or equivalent per week    Comment: rare   Drug use: No     Allergies   Augmentin  [amoxicillin -pot clavulanate]   Review of Systems Review of Systems See HPI  Physical Exam Triage Vital Signs ED Triage Vitals  Encounter Vitals Group     BP 02/04/24 1043 128/77     Girls Systolic BP Percentile --      Girls Diastolic BP Percentile --      Boys Systolic BP Percentile --      Boys Diastolic BP Percentile --      Pulse Rate 02/04/24 1043 (!) 57     Resp 02/04/24 1043 18     Temp 02/04/24 1043 98.2 F (36.8 C)     Temp Source 02/04/24 1043 Oral     SpO2 02/04/24 1043 96 %     Weight 02/04/24 1042 245 lb (111.1 kg)     Height 02/04/24 1042 5' 7 (1.702 m)     Head Circumference --      Peak Flow --      Pain Score 02/04/24 1042 6     Pain Loc --      Pain Education --      Exclude from Growth Chart --    No data found.  Updated Vital Signs BP 128/77 (BP Location: Right Arm)   Pulse (!) 57   Temp 98.2 F (36.8 C) (Oral)   Resp 18   Ht 5' 7 (1.702 m)   Wt 111.1 kg   LMP 07/06/2017   SpO2 96%   BMI 38.37 kg/m   Physical Exam Constitutional:      General: She is not in acute distress.    Appearance: She is well-developed.     Comments: Overweight.  Appears uncomfortable.  HENT:     Head: Normocephalic and atraumatic.  Eyes:  Conjunctiva/sclera: Conjunctivae normal.     Pupils: Pupils are equal, round, and reactive to light.  Cardiovascular:     Rate and Rhythm: Normal rate.  Pulmonary:     Effort: Pulmonary effort is normal. No  respiratory distress.  Abdominal:     General: Bowel sounds are normal. There is no distension.     Palpations: Abdomen is soft. There is no hepatomegaly or splenomegaly.     Tenderness: There is abdominal tenderness in the right upper quadrant.  Musculoskeletal:        General: Normal range of motion.     Cervical back: Normal range of motion.  Skin:    General: Skin is warm and dry.  Neurological:     Mental Status: She is alert.      UC Treatments / Results  Labs (all labs ordered are listed, but only abnormal results are displayed) Labs Reviewed  LIPASE  COMPREHENSIVE METABOLIC PANEL WITH GFR  CBC WITH DIFFERENTIAL/PLATELET    EKG   Radiology No results found.  Procedures Procedures (including critical [name] time)  Medications Ordered in UC Medications - No data to display  Initial Impression / Assessment and Plan / UC Course  I have reviewed the triage vital signs and the nursing notes.  Pertinent labs & imaging results that were available during my [name] of the patient were reviewed by me and considered in my medical decision making (see chart for details).     Explained the patient's symptoms are most consistent with acute cholecystitis.  She needs lab work and ultrasound.  Patient states she prefers not to go to an emergency room, and that the pain is tolerable at this point.  She understands that she must go to an emergency room if gets worse instead of better at any time.  I will do blood work to check for liver functions, and her CBC.  An ultrasound can be done in a couple of days if she is stable. Final Clinical Impressions(s) / UC Diagnoses   Final diagnoses:  Abdominal pain, right upper quadrant     Discharge Instructions      I am concerned this abdominal pain is caused by your gallbladder If you become acutely worse you must go to an emergency room I am going to order some blood work to check your liver enzymes, and to check for infection.  They  will be available in MyChart.  You will be called if anything is abnormal. You need an ultrasound of your gallbladder.  You can call your doctor Monday morning to see if they will schedule this, or you may return here for additional testing   ED Prescriptions   None    PDMP not reviewed this encounter.   Maranda Jamee Jacob, MD 02/04/24 [phone]

## 2024-02-04 NOTE — ED Triage Notes (Signed)
 Patient c/o RUQ pressure x 1 week, worse when she eats.  Denies any nausea, vomiting or diarrhea.  Patient has taken Ibuprofen and Gas-X.

## 2024-02-05 ENCOUNTER — Telehealth: Payer: Self-pay | Admitting: Emergency Medicine

## 2024-02-05 ENCOUNTER — Telehealth: Payer: Self-pay | Admitting: Family Medicine

## 2024-02-05 NOTE — Telephone Encounter (Signed)
 Spoke with patient states that she's feeling about the same.  Will continue with the bland diet and follow up with PCP.  Advised lab results are not back and if anything comes back abnormal, a nurse will give her a call.  Patient voices understanding.

## 2024-02-05 NOTE — Telephone Encounter (Signed)
 Patient continues to have right upper quadrant abdominal pain.  It is positional and worsened when she is sitting.  She has not had any worsening of symptoms, fever or chills, or vomiting.  I called her this afternoon to let her know that I had her comprehensive metabolic panel and lipase back.  They are all normal.  CBC is still pending.  She understands that if she becomes worse she must go to the emergency room.  Otherwise I recommend she call her PCP first thing in the morning to try to get a abdominal ultrasound.  If all else fails, she can register and be seen here and perhaps get an ultrasound depending on the providers opinion.

## 2024-02-06 ENCOUNTER — Ambulatory Visit (HOSPITAL_COMMUNITY): Payer: Self-pay

## 2024-02-06 LAB — CBC WITH DIFFERENTIAL/PLATELET
Basophils Absolute: 0 x10E3/uL (ref 0.0–0.2)
Basos: 1 %
EOS (ABSOLUTE): 0.1 x10E3/uL (ref 0.0–0.4)
Eos: 1 %
Hematocrit: 41.7 % (ref 34.0–46.6)
Hemoglobin: 14 g/dL (ref 11.1–15.9)
Immature Grans (Abs): 0 x10E3/uL (ref 0.0–0.1)
Immature Granulocytes: 0 %
Lymphocytes Absolute: 1.5 x10E3/uL (ref 0.7–3.1)
Lymphs: 42 %
MCH: 30.2 pg (ref 26.6–33.0)
MCHC: 33.6 g/dL (ref 31.5–35.7)
MCV: 90 fL (ref 79–97)
Monocytes Absolute: 0.2 x10E3/uL (ref 0.1–0.9)
Monocytes: 7 %
Neutrophils Absolute: 1.7 x10E3/uL (ref 1.4–7.0)
Neutrophils: 49 %
Platelets: 482 x10E3/uL — ABNORMAL HIGH (ref 150–450)
RBC: 4.63 x10E6/uL (ref 3.77–5.28)
RDW: 14.3 % (ref 11.7–15.4)
WBC: 3.5 x10E3/uL (ref 3.4–10.8)

## 2024-02-06 LAB — COMPREHENSIVE METABOLIC PANEL WITH GFR
ALT: 24 IU/L (ref 0–32)
AST: 20 IU/L (ref 0–40)
Albumin: 4.5 g/dL (ref 3.8–4.9)
Alkaline Phosphatase: 121 IU/L (ref 49–135)
BUN/Creatinine Ratio: 16 (ref 9–23)
BUN: 12 mg/dL (ref 6–24)
Bilirubin Total: 0.4 mg/dL (ref 0.0–1.2)
CO2: 23 mmol/L (ref 20–29)
Calcium: 10.2 mg/dL (ref 8.7–10.2)
Chloride: 104 mmol/L (ref 96–106)
Creatinine, Ser: 0.74 mg/dL (ref 0.57–1.00)
Globulin, Total: 2.5 g/dL (ref 1.5–4.5)
Glucose: 94 mg/dL (ref 70–99)
Potassium: 4.9 mmol/L (ref 3.5–5.2)
Sodium: 141 mmol/L (ref 134–144)
Total Protein: 7 g/dL (ref 6.0–8.5)
eGFR: 97 mL/min/1.73 (ref >59)

## 2024-02-06 LAB — LIPASE: Lipase: 34 U/L (ref 14–72)

## 2024-02-07 ENCOUNTER — Encounter: Payer: Self-pay | Admitting: Bariatrics

## 2024-02-07 ENCOUNTER — Other Ambulatory Visit: Payer: Self-pay | Admitting: Physician Assistant

## 2024-02-07 ENCOUNTER — Ambulatory Visit: Admitting: Bariatrics

## 2024-02-07 VITALS — BP 133/85 | HR 55 | Temp 97.9°F | Ht 67.0 in | Wt 241.0 lb

## 2024-02-07 DIAGNOSIS — E66812 Obesity, class 2: Secondary | ICD-10-CM

## 2024-02-07 DIAGNOSIS — K5900 Constipation, unspecified: Secondary | ICD-10-CM

## 2024-02-07 DIAGNOSIS — Z6837 Body mass index (BMI) 37.0-37.9, adult: Secondary | ICD-10-CM | POA: Diagnosis not present

## 2024-02-07 DIAGNOSIS — D75839 Thrombocytosis, unspecified: Secondary | ICD-10-CM | POA: Diagnosis not present

## 2024-02-07 DIAGNOSIS — E65 Localized adiposity: Secondary | ICD-10-CM

## 2024-02-07 DIAGNOSIS — E669 Obesity, unspecified: Secondary | ICD-10-CM

## 2024-02-07 DIAGNOSIS — K5909 Other constipation: Secondary | ICD-10-CM

## 2024-02-07 DIAGNOSIS — R1011 Right upper quadrant pain: Secondary | ICD-10-CM

## 2024-02-07 NOTE — Progress Notes (Signed)
 First follow-up after initial visit.        WEIGHT SUMMARY AND BIOMETRICS  Weight Lost Since Last Visit: 3lb  Weight Gained Since Last Visit: 0   Vitals Temp: 97.9 F (36.6 C) BP: 133/85 Pulse Rate: (!) 55 SpO2: 96 %   Anthropometric Measurements Height: 5' 7 (1.702 m) Weight: 241 lb (109.3 kg) BMI (Calculated): 37.74 Weight at Last Visit: 244lb Weight Lost Since Last Visit: 3lb Weight Gained Since Last Visit: 0 Starting Weight: 244lb Total Weight Loss (lbs): 3 lb (1.361 kg) Peak Weight: 249lb   Body Composition  Body Fat %: 44.9 % Fat Mass (lbs): 108.4 lbs Muscle Mass (lbs): 126.2 lbs Total Body Water (lbs): 87.8 lbs Visceral Fat Rating : 13   Other Clinical Data Fasting: no Labs: no Today's Visit #: 2 Starting Date: 01/24/24    OBESITY Cammi is here to discuss her progress with her obesity treatment plan along with follow-up of her obesity related diagnoses.    Nutrition Plan: the Category 2 plan and keeping a food journal with goal of 1300 calories and 100+ grams of protein daily - 75% adherence.  Current exercise: none  Interim History:  She is down 3 lbs since her first visit. She had some constipation with increased protein. She thinks that it is the cheese.  Eating all of the food on the plan., Protein intake is as prescribed, Is not skipping meals, and Water intake is adequate.  Initial positives regarding the dietary plan: She was able to stay at 1,500 calories.  Initial challenges regarding  the dietary plan: Too much cheese.   Pharmacotherapy: [place] is not on any  Hunger is moderately controlled.  Cravings are moderately controlled.  Assessment/Plan:    Visceral Obesity.   She has a visceral fat rating of 13 per the bio-impedence scale.   Plan: The goal is a visceral fat rating of 12 or below.  Will work on the  plan and increase exercise/begin exercise.  Information sheet on  Tips to lose belly fat . Aware that belly fat may be equate to visceral fat, but many of the same tips can help both subcutaneous and visceral fat.  Information sheet on  Healthy and Unhealthy fats.  Will minimize all carbohydrates ( sweets and starches ).   Will cut back on the cheese.  Will continue the Metamucil for constipation.  She will substitute other protein options for cheese.    Thrombocytosis:   Her platelet counts were slightly elevated but are now increasing.  She states that her platelet counts have been normal in the past and previous readings that I saw were normal.    Plan:  As her levels are coming down we will not pursue any action at this time and we will recheck her platelets in the future.  Constipation Mariaisabel notes constipation.   She has been taking Miralax. Constipation is moderately controlled.   Plan: Increase fiber up to 25 to 30 grams of fiber.  She will slowly increase.  Increase water intake to at least 64 ounces daily.  Add MiraLAX once daily.  May take twice a day or every other day based on results. Add Metamucil or Citrucel daily. Add magnesium citrate daily.    Labs reviewed today (CMP,CBC, and glucose).    Generalized Obesity: Current BMI BMI (Calculated): 37.74   Pharmacotherapy Plan She is not on any antiobesity medications at this time.  Ambera is currently in the action stage of change. As such, her goal is  to continue with weight loss efforts.  She has agreed to the Category 2 plan and keeping a food journal with goal of 1,300 calories and 100 grams of protein daily.  Exercise goals: All adults should avoid inactivity. Some physical activity is better than none, and adults who participate in any amount of physical activity gain some health benefits.  Behavioral modification strategies: increasing lean protein intake, decreasing simple carbohydrates , no meal skipping,  meal planning , increase water intake, better snacking choices, planning for success, increasing vegetables, increasing lower sugar fruits, increasing fiber rich foods, keep healthy foods in the home, weigh protein portions, measure portion sizes, and pack lunch for work.  Lavena has agreed to follow-up with our clinic in 2 weeks.    Objective:   VITALS: Per patient if applicable, see vitals. GENERAL: Alert and in no acute distress. CARDIOPULMONARY: No increased WOB. Speaking in clear sentences.  PSYCH: Pleasant and cooperative. Speech normal rate and rhythm. Affect is appropriate. Insight and judgement are appropriate. Attention is focused, linear, and appropriate.  NEURO: Oriented as arrived to appointment on time with no prompting.   Attestation Statements:   This was prepared with the assistance of Engineer, Civil (consulting).  Occasional wrong-word or sound-a-like substitutions may have occurred due to the inherent limitations of voice recognition software.   Clayborne Daring, DO

## 2024-02-08 ENCOUNTER — Ambulatory Visit: Admitting: Physician Assistant

## 2024-02-09 ENCOUNTER — Other Ambulatory Visit

## 2024-02-09 DIAGNOSIS — K760 Fatty (change of) liver, not elsewhere classified: Secondary | ICD-10-CM | POA: Diagnosis not present

## 2024-02-09 DIAGNOSIS — R1011 Right upper quadrant pain: Secondary | ICD-10-CM

## 2024-02-10 ENCOUNTER — Ambulatory Visit: Payer: Self-pay | Admitting: Physician Assistant

## 2024-02-10 DIAGNOSIS — K760 Fatty (change of) liver, not elsewhere classified: Secondary | ICD-10-CM | POA: Insufficient documentation

## 2024-02-10 NOTE — Progress Notes (Signed)
 Jennelle,   No acute findings on ultrasound. Fatty liver seen. Weight loss and healthy diet along with alcohol avoidance can help with that. More than welcome to schedule a visit to discuss in person.

## 2024-02-21 ENCOUNTER — Ambulatory Visit: Admitting: Bariatrics

## 2024-03-05 ENCOUNTER — Ambulatory Visit: Admitting: Bariatrics

## 2024-03-06 DIAGNOSIS — S86111A Strain of other muscle(s) and tendon(s) of posterior muscle group at lower leg level, right leg, initial encounter: Secondary | ICD-10-CM | POA: Diagnosis not present

## 2024-03-06 DIAGNOSIS — M21619 Bunion of unspecified foot: Secondary | ICD-10-CM | POA: Diagnosis not present

## 2024-03-12 ENCOUNTER — Ambulatory Visit: Admitting: Bariatrics

## 2024-03-29 DIAGNOSIS — J069 Acute upper respiratory infection, unspecified: Secondary | ICD-10-CM | POA: Diagnosis not present

## 2024-05-18 ENCOUNTER — Encounter: Payer: Self-pay | Admitting: Physician Assistant

## 2024-05-18 ENCOUNTER — Telehealth (INDEPENDENT_AMBULATORY_CARE_PROVIDER_SITE_OTHER): Admitting: Physician Assistant

## 2024-05-18 DIAGNOSIS — J0140 Acute pansinusitis, unspecified: Secondary | ICD-10-CM

## 2024-05-18 MED ORDER — AZITHROMYCIN 250 MG PO TABS: ORAL_TABLET | ORAL | 0 refills | Status: AC

## 2024-05-18 NOTE — Progress Notes (Signed)
.  Virtual Visit via Video Note  I connected with Rachel [name] on 05/18/24 at  9:10 AM EST by a video enabled telemedicine application and verified that I am speaking with the correct person using two identifiers.  Location: Patient: home Provider: clinic  .Participating in visit:  Patient: Rachel [name] Provider: Vermell Bologna PA-C   I discussed the limitations of evaluation and management by telemedicine and the availability of in person appointments. The patient expressed understanding and agreed to proceed.  History of Present Illness: Patient is a 54 yo female who calls into the clinic with sinus pressure, ear pressure, sore throat, dizziness persistent for the last week and a half. She initially ran a fever and had some body aches. She took OTC flonase and motrin and dayquil with little relief. She feels like head pressure is worsening. No SOB, cough.     Observations/Objective: No acute distress Normal breathing Normal mood and appearance   Assessment and Plan: .Diagnoses and all orders for this visit:  Acute non-recurrent pansinusitis -     azithromycin  (ZITHROMAX  Z-PAK) 250 MG tablet; Take 2 tablets (500 mg) on  Day 1,  followed by 1 tablet (250 mg) once daily on Days 2 through 5.   Continue flonase and added zpak due to PCN allergy Consider nasal sinus rinses Follow up as needed if symptoms persist or worsen   Follow Up Instructions:    I discussed the assessment and treatment plan with the patient. The patient was provided an opportunity to ask questions and all were answered. The patient agreed with the plan and demonstrated an understanding of the instructions.   The patient was advised to call back or seek an in-person evaluation if the symptoms worsen or if the condition fails to improve as anticipated.  I provided 5 minutes of non-face-to-face time during this encounter.   Macy Polio, PA-C
